# Patient Record
Sex: Female | Born: 1985 | Race: White | Hispanic: No | Marital: Married | Smoking: Never smoker
Health system: Southern US, Community
[De-identification: ages and names within clinical notes are randomized; demographics above are authoritative.]

## PROBLEM LIST (undated history)

## (undated) DIAGNOSIS — I951 Orthostatic hypotension: Principal | ICD-10-CM

## (undated) DIAGNOSIS — R000 Tachycardia, unspecified: Principal | ICD-10-CM

## (undated) DIAGNOSIS — R945 Abnormal results of liver function studies: Secondary | ICD-10-CM

## (undated) DIAGNOSIS — I498 Other specified cardiac arrhythmias: Secondary | ICD-10-CM

## (undated) DIAGNOSIS — D649 Anemia, unspecified: Secondary | ICD-10-CM

## (undated) DIAGNOSIS — O24419 Gestational diabetes mellitus in pregnancy, unspecified control: Secondary | ICD-10-CM

## (undated) DIAGNOSIS — G90A Postural orthostatic tachycardia syndrome (POTS): Secondary | ICD-10-CM

## (undated) DIAGNOSIS — D689 Coagulation defect, unspecified: Secondary | ICD-10-CM

## (undated) DIAGNOSIS — F419 Anxiety disorder, unspecified: Secondary | ICD-10-CM

## (undated) DIAGNOSIS — D682 Hereditary deficiency of other clotting factors: Secondary | ICD-10-CM

## (undated) DIAGNOSIS — Z5189 Encounter for other specified aftercare: Secondary | ICD-10-CM

## (undated) HISTORY — DX: Abnormal results of liver function studies: R94.5

## (undated) HISTORY — DX: Hereditary deficiency of other clotting factors: D68.2

## (undated) HISTORY — DX: Anxiety disorder, unspecified: F41.9

## (undated) HISTORY — DX: Anemia, unspecified: D64.9

## (undated) HISTORY — PX: COSMETIC SURGERY: SHX468

## (undated) HISTORY — DX: Gestational diabetes mellitus in pregnancy, unspecified control: O24.419

## (undated) HISTORY — DX: Encounter for other specified aftercare: Z51.89

## (undated) HISTORY — DX: Other specified cardiac arrhythmias: I49.8

## (undated) HISTORY — DX: Postural orthostatic tachycardia syndrome (POTS): G90.A

## (undated) HISTORY — DX: Coagulation defect, unspecified: D68.9

## (undated) HISTORY — DX: Orthostatic hypotension: I95.1

## (undated) HISTORY — DX: Tachycardia, unspecified: R00.0

---

## 2011-07-13 HISTORY — PX: MOUTH SURGERY: SHX715

## 2014-07-12 DIAGNOSIS — O24419 Gestational diabetes mellitus in pregnancy, unspecified control: Secondary | ICD-10-CM

## 2014-07-12 HISTORY — DX: Gestational diabetes mellitus in pregnancy, unspecified control: O24.419

## 2015-06-10 DIAGNOSIS — R7989 Other specified abnormal findings of blood chemistry: Secondary | ICD-10-CM

## 2015-06-10 DIAGNOSIS — R945 Abnormal results of liver function studies: Secondary | ICD-10-CM

## 2015-06-10 HISTORY — DX: Other specified abnormal findings of blood chemistry: R79.89

## 2016-03-24 ENCOUNTER — Ambulatory Visit: Payer: Self-pay | Admitting: Family

## 2016-05-18 ENCOUNTER — Other Ambulatory Visit: Payer: Self-pay | Admitting: Gastroenterology

## 2016-05-18 DIAGNOSIS — R945 Abnormal results of liver function studies: Secondary | ICD-10-CM

## 2016-05-18 DIAGNOSIS — R7989 Other specified abnormal findings of blood chemistry: Secondary | ICD-10-CM

## 2016-05-28 ENCOUNTER — Ambulatory Visit: Payer: Self-pay

## 2016-06-10 ENCOUNTER — Encounter (INDEPENDENT_AMBULATORY_CARE_PROVIDER_SITE_OTHER): Payer: Self-pay

## 2016-06-10 ENCOUNTER — Ambulatory Visit (INDEPENDENT_AMBULATORY_CARE_PROVIDER_SITE_OTHER): Payer: BLUE CROSS/BLUE SHIELD | Admitting: Internal Medicine

## 2016-06-10 ENCOUNTER — Ambulatory Visit: Payer: Self-pay | Admitting: Internal Medicine

## 2016-06-10 ENCOUNTER — Encounter: Payer: Self-pay | Admitting: Internal Medicine

## 2016-06-10 VITALS — BP 109/73 | HR 83 | Ht 59.0 in | Wt 96.2 lb

## 2016-06-10 DIAGNOSIS — G90A Postural orthostatic tachycardia syndrome (POTS): Secondary | ICD-10-CM

## 2016-06-10 DIAGNOSIS — R55 Syncope and collapse: Secondary | ICD-10-CM | POA: Diagnosis not present

## 2016-06-10 DIAGNOSIS — I951 Orthostatic hypotension: Secondary | ICD-10-CM

## 2016-06-10 DIAGNOSIS — R000 Tachycardia, unspecified: Secondary | ICD-10-CM

## 2016-06-10 MED ORDER — MIDODRINE HCL 2.5 MG PO TABS: ORAL_TABLET | ORAL | 1 refills | Status: DC

## 2016-06-10 NOTE — Patient Instructions (Addendum)
Medication Instructions: - Your physician has recommended you make the following change in your medication:  1) Proamitine (midodrine) 2.5 mg - take one tablet by mouth every 4 hours as needed  Labwork: - none ordered  Procedures/Testing: - .none ordered  Follow-Up: - Your physician recommends that you schedule a follow-up appointment in: 3 months with Dr. Graciela HusbandsKlein.  Any Additional Special Instructions Will Be Listed Below (If Applicable). 1) Thermatabs- take two tablets twice daily 2) Menses control 3) Increase fluid intake (water/ gatorade) 4) Compression clothes 5) Recumbant exercise   If you need a refill on your cardiac medications before your next appointment, please call your pharmacy.

## 2016-06-10 NOTE — Progress Notes (Signed)
ELECTROPHYSIOLOGY CONSULT NOTE  Patient ID: Catherine Mendez, MRN: [id number], DOB/AGE: 17-Aug-1985 30 y.o. Admit date: (Not on file) Date of Consult: 06/10/2016  Primary Physician: Jolene ProvostKirtida Patel, MD Primary Cardiologist: new  Consulting Physician new  Chief Complaint: syncope   HPI Catherine Mendez is a 30 y.o. female  With a 15 year history of syncope and exercise intolerance accompanied by palpitations and acral discoloration. There is also following this of thought.  She recounts her history well; she was seen at the [facility] [place] were no specific diagnosis was made. She does note that with her, history of "exercise syncope" that she was admitted for treadmill testing and had an event and no clear diagnosis was made--I am inferring from that that there was no arrhythmia. She has had problems with heat intolerance, menses intolerance, shower intolerance. Episodes of nausea and vomiting and/or diarrhea have been associated with frequent and predictable syncope. Interestingly, during the pregnancy with her daughter, Ivor MessierCora, she struggled throughout with hyperemesis never arriving at the phase of volume expansion wherein so many women will better in the second trimester.  She finally found a physician in [place] who thought she might be dysautonomic. This was true not withstanding a negative tilt table test. She was started on ProAmatine taking 2.5 mg once a day around the time of her heavy menses. She had noted an incredibly positive response to the drug. She has had problems with nausea and has taken some Zofran in the past with benefit.  She more recently has learned to abort her episodes by becoming recumbent.  We don't have access to her evaluation but I presume also that her echocardiogram was normal. She has been on birth control in the past. This was discontinued because of "tumors on her liver"   [facility]    Past Medical History:  Diagnosis  Date  . Factor XII deficiency (HCC)   . Gestational diabetes 2016  . POTS (postural orthostatic tachycardia syndrome)       Surgical History:  Past Surgical History:  Procedure Laterality Date  . MOUTH SURGERY  2013   bottom dental implant      Home Meds: Prior to Admission medications   Medication Sig Start Date End Date Taking? Authorizing Provider  ciprofloxacin (CIPRO) 100 MG tablet Take 100 mg by mouth 2 (two) times daily.    Yes Historical Provider, MD  midodrine (PROAMATINE) 2.5 MG tablet Take one tablet (2.5 mg) by mouth every 4 hours as needed 06/10/16  Yes Duke SalviaSteven C Kaisen Ackers, MD  predniSONE (DELTASONE) 10 MG tablet Take 5 mg by mouth daily with breakfast.   Yes Historical Provider, MD    Allergies:  Allergies  Allergen Reactions  . Iodine   . Shellfish Allergy Rash    Social History   Social History  . Marital status: Married    Spouse name: N/A  . Number of children: N/A  . Years of education: N/A   Occupational History  . Not on file.   Social History Main Topics  . Smoking status: Never Smoker  . Smokeless tobacco: Never Used  . Alcohol use Yes     Comment: wine occassionally  . Drug use: No  . Sexual activity: Not on file   Other Topics Concern  . Not on file   Social History Narrative  . No narrative on file     Family History  Problem Relation Age of Onset  . Clotting disorder Mother   .  Hypertension Father   . Clotting disorder Sister   . Factor VIII deficiency Sister      ROS:  Please see the history of present illness.     All other systems reviewed and negative.    Physical Exam: Blood pressure 109/73, pulse 83, height 4\' 11"  (1.499 m), weight 96 lb 4 oz (43.7 kg). General: Well developed, well nourished female in no acute distress. Head: Normocephalic, atraumatic, sclera non-icteric, no xanthomas, nares are without discharge. EENT: normal  Lymph Nodes:  none Neck: Negative for carotid bruits. JVD not elevated. Back:without  scoliosis kyphosis Lungs: Clear bilaterally to auscultation without wheezes, rales, or rhonchi. Breathing is unlabored. Heart: RRR with S1 S2. No  murmur . No rubs, or gallops appreciated. Abdomen: Soft, non-tender, non-distended with normoactive bowel sounds. No hepatomegaly. No rebound/guarding. No obvious abdominal masses. Msk:  Strength and tone appear normal for age. Extremities: No clubbing or cyanosis. No  edema.  Distal pedal pulses are 2+ and equal bilaterally. Skin: Warm and Dry Neuro: Alert and oriented X 3. CN III-XII intact Grossly normal sensory and motor function . Psych:  Responds to questions appropriately with a normal affect.      Labs: Cardiac Enzymes No results for input(s): CKTOTAL, CKMB, TROPONINI in the last 72 hours. CBC No results found for: WBC, HGB, HCT, MCV, PLT PROTIME: No results for input(s): LABPROT, INR in the last 72 hours. Chemistry No results for input(s): NA, K, CL, CO2, BUN, CREATININE, CALCIUM, PROT, BILITOT, ALKPHOS, ALT, AST, GLUCOSE in the last 168 hours.  Invalid input(s): LABALBU Lipids No results found for: CHOL, HDL, LDLCALC, TRIG BNP No results found for: PROBNP Thyroid Function Tests: No results for input(s): TSH, T4TOTAL, T3FREE, THYROIDAB in the last 72 hours.  Invalid input(s): FREET3 Miscellaneous No results found for: DDIMER  Radiology/Studies:  No results found.  EKG:    Assessment and Plan:  Dysautonomia  Liver tumors   We discussed extensively the issues of dysautonomia, the physiology of orthstasis and positional stress.  We discussed the role of salt and water repletion, the importance of exercise, often needing to be started in the recumbent position, and the awareness of triggers and the role of ambient heat and dehydration  We would recommend the following -increased fluid intake until the urine is clear -salt supplementation with either ThermaTabs or salt tablets 2 twice a day -follow-up with gynecology  regarding suppression of menses , although with a history of "liver tumors "I'm not quite sure what can be done-NDRF.org  And POTSPlace.com are potentially helpful websites - be very attentive to warning and becoming recumbent upon the onset of symptoms - exercise is very important. Recumbent exercise with the bike, rowing machine or swimming is preferred. It is important to remember that dizziness can be worse following exercise  We will initiate ProAmatine 2.5 mg 3 times daily. We discussed the alternative of using Florinef. I also mentioned that there are no good data in randomized trials as to the effectiveness but anecdotally at least the former has worked for her,          Sherryl MangesSteven Trenesha Alcaide

## 2016-09-09 ENCOUNTER — Encounter: Payer: Self-pay | Admitting: Internal Medicine

## 2016-09-09 ENCOUNTER — Ambulatory Visit (INDEPENDENT_AMBULATORY_CARE_PROVIDER_SITE_OTHER): Payer: BLUE CROSS/BLUE SHIELD | Admitting: Internal Medicine

## 2016-09-09 VITALS — BP 92/68 | HR 70 | Ht 59.0 in | Wt 97.5 lb

## 2016-09-09 DIAGNOSIS — G901 Familial dysautonomia [Riley-Day]: Secondary | ICD-10-CM

## 2016-09-09 DIAGNOSIS — G909 Disorder of the autonomic nervous system, unspecified: Secondary | ICD-10-CM | POA: Diagnosis not present

## 2016-09-09 MED ORDER — MIDODRINE HCL 2.5 MG PO TABS: ORAL_TABLET | ORAL | Status: DC

## 2016-09-09 NOTE — Progress Notes (Signed)
      Patient Care Team: Jolene ProvostKirtida Patel, MD as PCP - General (Family Medicine) Doy MinceHeather R Ratcliffe, PA-C (Physician Assistant)   HPI  Catherine Helmut MusterM Brassell is a 31 y.o. female Seen in follow-up for dysautonomia She has been seen in [place] and [place]. She had a negative tilt table test.  At her last visit we initiated ProAmatine and she had taken previously.  Overall she is much better. The end of the afternoons are somewhat of stroke. In this regard is notable that she takes her ProAmatine 7/12 and only twice a day  She continues to struggle with heat. She has difficulty on the stairs with exercise intolerance. She reminds me that interval training previously had been associated with recurrent syncope  R  Past Medical History:  Diagnosis Date  . Factor XII deficiency (HCC)   . Gestational diabetes 2016  . POTS (postural orthostatic tachycardia syndrome)     Past Surgical History:  Procedure Laterality Date  . MOUTH SURGERY  2013   bottom dental implant     Current Outpatient Prescriptions  Medication Sig Dispense Refill  . levofloxacin (LEVAQUIN) 500 MG tablet Take 500 mg by mouth daily.   0  . midodrine (PROAMATINE) 2.5 MG tablet Take one tablet (2.5 mg) by mouth every 4 hours as needed 120 tablet 1   No current facility-administered medications for this visit.     Allergies  Allergen Reactions  . Iodine   . Shellfish Allergy Rash      Review of Systems negative except from HPI and PMH  Physical Exam BP 92/68 (BP Location: Left Arm, Patient Position: Sitting, Cuff Size: Normal)   Pulse 70   Ht 4\' 11"  (1.499 m)   Wt 97 lb 8 oz (44.2 kg)   BMI 19.69 kg/m  Well developed and well nourished in no acute distress HENT normal E scleral and icterus clear Neck Supple JVP flat; carotids brisk and full Chest clear Regular rate and rhythm, no murmurs gallops or rub Soft with active bowel sounds No clubbing cyanosis  Edema Alert and oriented, grossly normal motor  and sensory function Skin Warm and Dry  ECG sinus rhythm at 70 Intervals 06/18/39  Assessment and  Plan  Dysautonomia  Hypotension  Syncope  No interval syncope. We will increase her ProAmatine from twice a day--3 times a day at 7/11/3 to try to help with the afternoon. I suggested she get a blood pressure monitor to see what her blood pressures are within target of increasing her ProAmatine for target blood pressure of about 100 to see if this doesn't help with her exercise symptoms. We have discussed strategies for returning to aerobic exercise including running, monitored running.   . We reviewed salt supplementation and have recommended NUUN electrolytes   We spent more than 50% of our >25 min visit in face to face counseling regarding the above     Current medicines are reviewed at length with the patient today .  The patient does not  have concerns regarding medicines.

## 2016-09-09 NOTE — Patient Instructions (Addendum)
Medication Instructions: - Your physician has recommended you make the following change in your medication:  1) Increase proamitine 2.5 mg- take one tablet at 7 am, 11 am, & 3 pm  Labwork: - none ordered  Procedures/Testing: - none ordered  Follow-Up: - Your physician wants you to follow-up in: May/ June with Dr. Graciela HusbandsKlein. You will receive a reminder letter in the mail two months in advance. If you don't receive a letter, please call our office to schedule the follow-up appointment.  Any Additional Special Instructions Will Be Listed Below (If Applicable).     If you need a refill on your cardiac medications before your next appointment, please call your pharmacy.

## 2016-09-10 ENCOUNTER — Encounter: Payer: Self-pay | Admitting: Emergency Medicine

## 2016-09-10 ENCOUNTER — Emergency Department
Admission: EM | Admit: 2016-09-10 | Discharge: 2016-09-10 | Disposition: A | Discharge status: Home / Self Care | Payer: BLUE CROSS/BLUE SHIELD | Attending: Emergency Medicine | Admitting: Emergency Medicine

## 2016-09-10 DIAGNOSIS — Z79899 Other long term (current) drug therapy: Secondary | ICD-10-CM | POA: Diagnosis not present

## 2016-09-10 DIAGNOSIS — R55 Syncope and collapse: Secondary | ICD-10-CM | POA: Diagnosis present

## 2016-09-10 DIAGNOSIS — K859 Acute pancreatitis without necrosis or infection, unspecified: Secondary | ICD-10-CM | POA: Diagnosis not present

## 2016-09-10 LAB — COMPREHENSIVE METABOLIC PANEL
ALBUMIN: 4.1 g/dL (ref 3.5–5.0)
ALK PHOS: 67 U/L (ref 38–126)
ALT: 44 U/L (ref 14–54)
AST: 46 U/L — ABNORMAL HIGH (ref 15–41)
Anion gap: 9 (ref 5–15)
BUN: 9 mg/dL (ref 6–20)
CALCIUM: 8.8 mg/dL — AB (ref 8.9–10.3)
CO2: 25 mmol/L (ref 22–32)
CREATININE: 0.69 mg/dL (ref 0.44–1.00)
Chloride: 103 mmol/L (ref 101–111)
GFR calc Af Amer: >60 mL/min (ref >60)
GFR calc non Af Amer: >60 mL/min (ref >60)
GLUCOSE: 99 mg/dL (ref 65–99)
Potassium: 3.9 mmol/L (ref 3.5–5.1)
SODIUM: 137 mmol/L (ref 135–145)
Total Bilirubin: 0.7 mg/dL (ref 0.3–1.2)
Total Protein: 7.3 g/dL (ref 6.5–8.1)

## 2016-09-10 LAB — CBC WITH DIFFERENTIAL/PLATELET
BASOS PCT: 1 %
Basophils Absolute: 0 10*3/uL (ref 0–0.1)
Eosinophils Absolute: 0.1 10*3/uL (ref 0–0.7)
Eosinophils Relative: 1 %
HEMATOCRIT: 39.7 % (ref 35.0–47.0)
HEMOGLOBIN: 13.6 g/dL (ref 12.0–16.0)
Lymphocytes Relative: 48 %
Lymphs Abs: 3.4 10*3/uL (ref 1.0–3.6)
MCH: 31.3 pg (ref 26.0–34.0)
MCHC: 34.3 g/dL (ref 32.0–36.0)
MCV: 91.1 fL (ref 80.0–100.0)
MONOS PCT: 13 %
Monocytes Absolute: 0.9 10*3/uL (ref 0.2–0.9)
NEUTROS ABS: 2.7 10*3/uL (ref 1.4–6.5)
NEUTROS PCT: 39 %
Platelets: 231 10*3/uL (ref 150–440)
RBC: 4.36 MIL/uL (ref 3.80–5.20)
RDW: 13.2 % (ref 11.5–14.5)
WBC: 7.1 10*3/uL (ref 3.6–11.0)

## 2016-09-10 LAB — POCT PREGNANCY, URINE: Preg Test, Ur: NEGATIVE

## 2016-09-10 LAB — LIPASE, BLOOD: Lipase: 112 U/L — ABNORMAL HIGH (ref 11–51)

## 2016-09-10 MED ORDER — SODIUM CHLORIDE 0.9 % IV BOLUS (SEPSIS)
1000.0000 mL | Freq: Once | INTRAVENOUS | Status: AC
  Administered 2016-09-10: 1000 mL via INTRAVENOUS

## 2016-09-10 NOTE — ED Provider Notes (Signed)
[facility] Emergency Department Provider Note  ____________________________________________   First MD Initiated Contact with Patient 09/10/16 1012     (approximate)  I have reviewed the triage vital signs and the nursing notes.   HISTORY  Chief Complaint Hypotension and Loss of Consciousness   HPI Catherine Mendez is a 31 y.o. female with a history of dysautonomia as well as multiple episodes of syncope who is presenting after taking a blood so today. She says that she was walking across the campus at her job when she began having abdominal cramping. She says that she then felt like she was going to pass out. She denies any chest pain or shortness of breath. However, she says that the feeling of her passing out involves a sensation of "my whole body choking." She says that this happened multiple times before but it is not happened for about 1 year. She says that it usually happens in response to pain. Patient said that she also had diarrhea this morning. Says that she had tomato sauce as well for dinner and that this usually causes her diarrhea. Says that she has had cramping in her right upper quadrant ever since she gave birth over one year ago and has had some difficulty with her liver labs. She is also on 5 mg of native drain for her dysautonomia and intermittent low blood pressure. She says that her normal systolics in the 90s and her normal diastolic pressures in the 60s. She is denying any pain at this time. Unclear exactly how long the patient was passed out. She said that she was found by her husband in her car unconscious and had been in and out of consciousness on the way to the hospital today.   Past Medical History:  Diagnosis Date  . Factor XII deficiency (HCC)   . Gestational diabetes 2016  . POTS (postural orthostatic tachycardia syndrome)     There are no active problems to display for this patient.   Past Surgical History:  Procedure  Laterality Date  . MOUTH SURGERY  2013   bottom dental implant     Prior to Admission medications   Medication Sig Start Date End Date Taking? Authorizing Provider  levofloxacin (LEVAQUIN) 500 MG tablet Take 500 mg by mouth daily.  08/29/16 09/11/16 Yes Historical Provider, MD  loperamide (IMODIUM) 2 MG capsule Take 2 mg by mouth as needed for diarrhea or loose stools.   Yes Historical Provider, MD  midodrine (PROAMATINE) 2.5 MG tablet Take one tablet (2.5 mg) three times a day at 7 am, 11 am, & 3 pm Patient taking differently: Take 5 mg by mouth 3 (three) times daily. Take one tablet (2.5 mg) three times a day at 7 am, 11 am, & 3 pm 09/09/16  Yes Duke Salvia, MD    Allergies Iodine and Shellfish allergy  Family History  Problem Relation Age of Onset  . Clotting disorder Mother   . Hypertension Father   . Clotting disorder Sister   . Factor VIII deficiency Sister     Social History Social History  Substance Use Topics  . Smoking status: Never Smoker  . Smokeless tobacco: Never Used  . Alcohol use Yes     Comment: wine occassionally    Review of Systems Constitutional: No fever/chills Eyes: No visual changes. ENT: No sore throat. Cardiovascular: Denies chest pain. Respiratory: Denies shortness of breath. Gastrointestinal: No nausea, no vomiting.  No constipation. Genitourinary: Negative for dysuria. Musculoskeletal: Negative for  back pain. Skin: Negative for rash. Neurological: Negative for headaches, focal weakness or numbness.  10-point ROS otherwise negative.  ____________________________________________   PHYSICAL EXAM:  VITAL SIGNS: ED Triage Vitals  Enc Vitals Group     BP 09/10/16 1013 (!) 79/39     Pulse Rate 09/10/16 1013 68     Resp 09/10/16 1013 18     Temp 09/10/16 1013 98.9 F (37.2 C)     Temp Source 09/10/16 1013 Oral     SpO2 09/10/16 1013 98 %     Weight 09/10/16 1013 98 lb (44.5 kg)     Height 09/10/16 1013 4\' 11"  (1.499 m)     Head  Circumference --      Peak Flow --      Pain Score 09/10/16 1014 4     Pain Loc --      Pain Edu? --      Excl. in GC? --     Constitutional: Alert and oriented. Well appearing and in no acute distress. Eyes: Conjunctivae are normal. PERRL. EOMI. Head: Atraumatic. Nose: No congestion/rhinnorhea. Mouth/Throat: Mucous membranes are moist.   Neck: No stridor.   Cardiovascular: Normal rate, regular rhythm. Grossly normal heart sounds.   Respiratory: Normal respiratory effort.  No retractions. Lungs CTAB. Gastrointestinal: Soft and nontender.Murphy sign. No distention.  Musculoskeletal: No lower extremity tenderness nor edema.  No joint effusions. Neurologic:  Normal speech and language. No gross focal neurologic deficits are appreciated.  Skin:  Skin is warm, dry and intact. No rash noted. Psychiatric: Mood and affect are normal. Speech and behavior are normal.  ____________________________________________   LABS (all labs ordered are listed, but only abnormal results are displayed)  Labs Reviewed  LIPASE, BLOOD - Abnormal; Notable for the following:       Result Value   Lipase 112 (*)    All other components within normal limits  COMPREHENSIVE METABOLIC PANEL - Abnormal; Notable for the following:    Calcium 8.8 (*)    AST 46 (*)    All other components within normal limits  CBC WITH DIFFERENTIAL/PLATELET  POC URINE PREG, ED  POCT PREGNANCY, URINE   ____________________________________________  EKG  ED ECG REPORT I, Swan Zayed,  Teena Irani, the attending physician, personally viewed and interpreted this ECG.   Date: 09/10/2016  EKG Time: 1012  Rate: 71  Rhythm: normal sinus rhythm  Axis: Normal  Intervals:none  ST&T Change: No ST segment elevation or depression. No abnormal T-wave inversion.  ____________________________________________  RADIOLOGY   ____________________________________________   PROCEDURES  Procedure(s) performed:   Procedures  Critical  Care performed:   ____________________________________________   INITIAL IMPRESSION / ASSESSMENT AND PLAN / ED COURSE  Pertinent labs & imaging results that were available during my care of the patient were reviewed by me and considered in my medical decision making (see chart for details).  ----------------------------------------- 1:41 PM on 09/10/2016 -----------------------------------------  Patient without abdominal pain at this time. Blood pressure remains in the 90s over 60s. Does not have an elevated lipase the patient without abdominal pain at this time. No vomiting in the emergency department. We discussed possible mild pancreatitis and eating a bland diet over the next few days. She has a gastroenterologist as well as a cardiologist who she will follow up with. To be discharged home at this time. Likely vasovagal syncope exacerbated by dehydration and the patient's underlying autonomic instability.      ____________________________________________   FINAL CLINICAL IMPRESSION(S) / ED DIAGNOSES  Syncope. Pancreatitis.  NEW MEDICATIONS STARTED DURING THIS VISIT:  New Prescriptions   No medications on file     Note:  This document was prepared using Dragon voice recognition software and may include unintentional dictation errors.    Myrna Blazeravid Matthew Fedrick Cefalu, MD 09/10/16 [phone]

## 2016-09-10 NOTE — ED Triage Notes (Signed)
Pt  Brought in by husband with c/o syncope, abd and nausea. Pt with hx of low bp. Pt very pale and lethargic in triage.

## 2016-11-05 ENCOUNTER — Encounter: Payer: Self-pay | Admitting: Medical

## 2016-11-05 ENCOUNTER — Ambulatory Visit: Payer: Self-pay | Admitting: Medical

## 2016-11-05 VITALS — BP 98/74 | HR 120 | Temp 100.6°F | Resp 18 | Ht 59.0 in | Wt 96.0 lb

## 2016-11-05 DIAGNOSIS — G90A Postural orthostatic tachycardia syndrome (POTS): Secondary | ICD-10-CM | POA: Insufficient documentation

## 2016-11-05 DIAGNOSIS — R000 Tachycardia, unspecified: Secondary | ICD-10-CM

## 2016-11-05 DIAGNOSIS — R112 Nausea with vomiting, unspecified: Secondary | ICD-10-CM

## 2016-11-05 DIAGNOSIS — I951 Orthostatic hypotension: Secondary | ICD-10-CM

## 2016-11-05 DIAGNOSIS — R197 Diarrhea, unspecified: Secondary | ICD-10-CM

## 2016-11-05 NOTE — Patient Instructions (Signed)
To go to the Emergency Department for fluid replacement and further evaluation.

## 2016-11-05 NOTE — Progress Notes (Addendum)
Patient did not go to the Emergency Department per my recommendation. However her vomiting and diarrhea continued and she went to [facility] Emergency Department on 11/09/2016. EKG showed borderline  QTc prolongation and nonspecific T-wave inversion in V1 no ST elevation. she was discharged home with Phenergan suppositories, Loperamide and Ondansetron..  Subjective:    Patient ID: Catherine Mendez, female    DOB: February 22, 1986, 31 y.o.   MRN: [id number]  HPI 31 yo female ( Ate [employer] salad 11am yesterday and the night before with some chicken)  started yesterday  with diarrhea in the morning  2-3 am,  Worked yesterday. Abdominal pain before lunch. Then after work started with vomiting  4 x yesterday and diarrhea 10 x / midnight , and continued through out the night.  Worsen diarrhea between  12am and 4 am. Got up about 7:30 am ( hr 145 after warm bath) and now 7 x diarrhea till 10am .  10 am -2 pm one time.  Total amount of diarrhea per patient is about 30 times total) Soiled herself 3 times because she could not reach the bathroom soon enough. Took imodium (2 tablets)  8 am and the second around  9 am. Diarrhea seemed to slow down.  Has not vomited any more.  Drank one 12 OZ  gatorade  At about noon with ice chips.  When  up and moving her heart starts to race and she gets short of breath. Midodrine last dose was at  8 am  And  12 noon  ( patient takes it 3 times / day).   Review of Systems  Constitutional: Positive for fatigue. Negative for chills and fever.  HENT: Negative.   Eyes: Negative.   Respiratory: Positive for shortness of breath. Negative for cough, choking and chest tightness.   Cardiovascular: Positive for palpitations. Negative for chest pain and leg swelling.  Gastrointestinal: Positive for abdominal pain, diarrhea, nausea and vomiting. Negative for abdominal distention, anal bleeding, blood in stool and constipation.  Endocrine: Negative for polydipsia, polyphagia and polyuria.   Genitourinary: Positive for dysuria. Negative for frequency, hematuria and urgency.  Musculoskeletal: Positive for myalgias. Negative for back pain, joint swelling, neck pain and neck stiffness.  Skin: Negative for rash and wound.  Neurological: Positive for dizziness, weakness and headaches. Negative for light-headedness.  Hematological: Negative for adenopathy. Does not bruise/bleed easily.  Psychiatric/Behavioral: Negative for agitation, behavioral problems, confusion, hallucinations, self-injury, sleep disturbance and suicidal ideas. The patient is not nervous/anxious.   feels she does look pale. Dizziness last night    Objective:   Physical Exam  Constitutional: She appears well-developed and well-nourished.  HENT:  Head: Normocephalic and atraumatic.  Right Ear: External ear normal.  Left Ear: External ear normal.  Nose: Nose normal.  Mouth/Throat: Oropharynx is clear and moist.  Eyes: EOM are normal. Pupils are equal, round, and reactive to light.  Neck: Normal range of motion. Neck supple.  Cardiovascular: Normal rate.   petite individual.  Hr 105 laying down. Pale looking.dry mucous membranes.          Assessment & Plan:  Vomitng and Diarrhea and abdominal pain, fever. Discussed with patient I think she is dehydrated and needs to go to the Emergency Department with her history of POTS. She is agreeable to this.  Called triage at [facility] [place] and reveiwed the patient/ name of patient with Elige Radon. Patient left while I was in seeing another patient. Patient escorted by Lynelle Doctor RN to her vehicle where her  husband was driving. He also  seemed concerned per RN.

## 2016-11-05 NOTE — Progress Notes (Signed)
Patient has had diarrhea and vomiting since yesterday.  On Wed night had a salad from a restaurant.  On Thursday she had diarrhea 3x before lunch, then had a salad and started feeling very poorly.  Has vomited about 4 times and has had diarrhea 20-30x since yesterday.  OTC imodium, gatorade.  Hx of POTS - or dysautonomia.

## 2016-11-09 ENCOUNTER — Emergency Department
Admission: EM | Admit: 2016-11-09 | Discharge: 2016-11-09 | Disposition: A | Discharge status: Home / Self Care | Payer: BLUE CROSS/BLUE SHIELD | Attending: Emergency Medicine | Admitting: Emergency Medicine

## 2016-11-09 ENCOUNTER — Encounter: Payer: Self-pay | Admitting: Medical Oncology

## 2016-11-09 DIAGNOSIS — Z79899 Other long term (current) drug therapy: Secondary | ICD-10-CM | POA: Diagnosis not present

## 2016-11-09 DIAGNOSIS — R55 Syncope and collapse: Secondary | ICD-10-CM | POA: Insufficient documentation

## 2016-11-09 DIAGNOSIS — R112 Nausea with vomiting, unspecified: Secondary | ICD-10-CM | POA: Insufficient documentation

## 2016-11-09 DIAGNOSIS — R197 Diarrhea, unspecified: Secondary | ICD-10-CM | POA: Insufficient documentation

## 2016-11-09 LAB — CBC
HCT: 40.8 % (ref 35.0–47.0)
HEMOGLOBIN: 14 g/dL (ref 12.0–16.0)
MCH: 31.4 pg (ref 26.0–34.0)
MCHC: 34.3 g/dL (ref 32.0–36.0)
MCV: 91.5 fL (ref 80.0–100.0)
Platelets: 237 10*3/uL (ref 150–440)
RBC: 4.46 MIL/uL (ref 3.80–5.20)
RDW: 13.2 % (ref 11.5–14.5)
WBC: 8.1 10*3/uL (ref 3.6–11.0)

## 2016-11-09 LAB — COMPREHENSIVE METABOLIC PANEL
ALBUMIN: 4.2 g/dL (ref 3.5–5.0)
ALT: 18 U/L (ref 14–54)
ANION GAP: 7 (ref 5–15)
AST: 20 U/L (ref 15–41)
Alkaline Phosphatase: 62 U/L (ref 38–126)
BILIRUBIN TOTAL: 0.6 mg/dL (ref 0.3–1.2)
BUN: 7 mg/dL (ref 6–20)
CO2: 26 mmol/L (ref 22–32)
Calcium: 8.7 mg/dL — ABNORMAL LOW (ref 8.9–10.3)
Chloride: 104 mmol/L (ref 101–111)
Creatinine, Ser: 0.64 mg/dL (ref 0.44–1.00)
GFR calc Af Amer: >60 mL/min (ref >60)
GFR calc non Af Amer: >60 mL/min (ref >60)
GLUCOSE: 86 mg/dL (ref 65–99)
POTASSIUM: 3.5 mmol/L (ref 3.5–5.1)
SODIUM: 137 mmol/L (ref 135–145)
TOTAL PROTEIN: 7.4 g/dL (ref 6.5–8.1)

## 2016-11-09 LAB — URINALYSIS, COMPLETE (UACMP) WITH MICROSCOPIC
Bacteria, UA: NONE SEEN
Bilirubin Urine: NEGATIVE
Glucose, UA: NEGATIVE mg/dL
Ketones, ur: NEGATIVE mg/dL
Leukocytes, UA: NEGATIVE
NITRITE: NEGATIVE
Protein, ur: NEGATIVE mg/dL
SPECIFIC GRAVITY, URINE: 1.009 (ref 1.005–1.030)
pH: 5 (ref 5.0–8.0)

## 2016-11-09 LAB — LIPASE, BLOOD: LIPASE: 20 U/L (ref 11–51)

## 2016-11-09 LAB — POCT PREGNANCY, URINE: Preg Test, Ur: NEGATIVE

## 2016-11-09 MED ORDER — ONDANSETRON 4 MG PO TBDP: 4.0000 mg | ORAL_TABLET | Freq: Three times a day (TID) | ORAL | 0 refills | Status: DC | PRN

## 2016-11-09 MED ORDER — SODIUM CHLORIDE 0.9 % IV BOLUS (SEPSIS)
1000.0000 mL | Freq: Once | INTRAVENOUS | Status: AC
Start: 2016-11-09 — End: 2016-11-09
  Administered 2016-11-09: 1000 mL via INTRAVENOUS

## 2016-11-09 MED ORDER — ONDANSETRON HCL 4 MG/2ML IJ SOLN
4.0000 mg | Freq: Once | INTRAMUSCULAR | Status: AC
  Administered 2016-11-09: 4 mg via INTRAVENOUS
  Filled 2016-11-09: qty 2

## 2016-11-09 MED ORDER — PROMETHAZINE HCL 25 MG RE SUPP: 25.0000 mg | Freq: Four times a day (QID) | RECTAL | 0 refills | Status: DC | PRN

## 2016-11-09 MED ORDER — LOPERAMIDE HCL 2 MG PO TABS: 2.0000 mg | ORAL_TABLET | Freq: Four times a day (QID) | ORAL | 0 refills | Status: DC | PRN

## 2016-11-09 MED ORDER — SODIUM CHLORIDE 0.9 % IV BOLUS (SEPSIS)
1000.0000 mL | Freq: Once | INTRAVENOUS | Status: AC
  Administered 2016-11-09: 1000 mL via INTRAVENOUS

## 2016-11-09 NOTE — ED Provider Notes (Signed)
[facility] Emergency Department Provider Note  ____________________________________________  Time seen: Approximately 9:13 AM  I have reviewed the triage vital signs and the nursing notes.   HISTORY  Chief Complaint Emesis and Diarrhea    HPI Raynisha TYJAH HAI is a 31 y.o. female with a history of POTS presenting with 5 days of nausea vomiting and diarrhea associated with lightheadedness. The patient reportsthat both her daughter and husband had several days of similar symptoms prior to the onset of her symptoms. On Thursday, she developed multiple episodes of nausea and vomiting as well as loose stool that was nonbloody. She took "lots" of loperamide, and then did not have any bowel movement for 2-3 days. This morning, she developed loose stool again.  Over the past several days, she has had persistent orthostatic lightheadedness, and 2 occasions where she felt presyncopal. She is not had any fever, localized abdominal pain, urinary symptoms.  She has been unable to take her midodrine and pantoprazole due to vomiting.   Past Medical History:  Diagnosis Date  . Factor XII deficiency (HCC)   . Gestational diabetes 2016  . POTS (postural orthostatic tachycardia syndrome)     Patient Active Problem List   Diagnosis Date Noted  . POTS (postural orthostatic tachycardia syndrome) 11/05/2016    Past Surgical History:  Procedure Laterality Date  . MOUTH SURGERY  2013   bottom dental implant     Current Outpatient Rx  . Order #: [id number] Class: Historical Med  . Order #: [id number] Class: Print  . Order #: [id number] Class: No Print  . Order #: [id number] Class: Print  . Order #: [id number] Class: Historical Med  . Order #: [id number] Class: Print    Allergies Iodine and Shellfish allergy  Family History  Problem Relation Age of Onset  . Clotting disorder Mother   . Hypertension Father   . Clotting disorder Sister   . Factor VIII deficiency Sister      Social History Social History  Substance Use Topics  . Smoking status: Never Smoker  . Smokeless tobacco: Never Used  . Alcohol use Yes     Comment: wine occassionally    Review of Systems Constitutional: No fever/chills.Positive lightheadedness without syncope. Positive presyncope. Eyes: No visual changes. ENT: No sore throat. No congestion or rhinorrhea. No ear pain. Cardiovascular: Denies chest pain. Denies palpitations. Respiratory: Denies shortness of breath.  No cough. Gastrointestinal: Mild nonfocal abdominal "soreness."  + nausea, + vomiting.  + diarrhea.  No constipation. Genitourinary: Negative for dysuria. No urinary frequency. Musculoskeletal: Negative for back pain. Skin: Negative for rash. Neurological: Negative for headaches. No focal numbness, tingling or weakness.   10-point ROS otherwise negative.  ____________________________________________   PHYSICAL EXAM:  VITAL SIGNS: ED Triage Vitals  Enc Vitals Group     BP 11/09/16 0847 100/77     Pulse Rate 11/09/16 0842 95     Resp 11/09/16 0842 18     Temp 11/09/16 0842 97.9 F (36.6 C)     Temp Source 11/09/16 0842 Oral     SpO2 11/09/16 0842 99 %     Weight 11/09/16 0842 95 lb (43.1 kg)     Height 11/09/16 0842  (1.499 m)     Head Circumference --      Peak Flow --      Pain Score 11/09/16 0841 2     Pain Loc --      Pain Edu? --      Excl. in GC? --  Constitutional: Alert and oriented. Well appearing and in no acute distress. Answers questions appropriately. Eyes: Conjunctivae are normal.  EOMI. No scleral icterus. Head: Atraumatic. Nose: No congestion/rhinnorhea. Mouth/Throat: Mucous membranes are Mildly dry.  Neck: No stridor.  Supple.  No meningismus Cardiovascular: Normal rate, regular rhythm. No murmurs, rubs or gallops. Blood pressure 98/77 on my evaluation. Respiratory: Normal respiratory effort.  No accessory muscle use or retractions. Lungs CTAB.  No wheezes, rales or  ronchi. Gastrointestinal: Soft, nontender and nondistended.  No guarding or rebound.  No peritoneal signs. Musculoskeletal: No LE edema.  Neurologic:  A&Ox3.  Speech is clear.  Face and smile are symmetric.  EOMI.  Moves all extremities well. Skin:  Skin is warm, dry and intact. No rash noted. Psychiatric: Mood and affect are normal. Speech and behavior are normal.  Normal judgement.  ____________________________________________   LABS (all labs ordered are listed, but only abnormal results are displayed)  Labs Reviewed  COMPREHENSIVE METABOLIC PANEL - Abnormal; Notable for the following:       Result Value   Calcium 8.7 (*)    All other components within normal limits  LIPASE, BLOOD  CBC  URINALYSIS, COMPLETE (UACMP) WITH MICROSCOPIC  POC URINE PREG, ED   ____________________________________________  EKG  ED ECG REPORT I, Rockne Menghini, the attending physician, personally viewed and interpreted this ECG.   Date: 11/09/2016  EKG Time: 927  Rate: 93  Rhythm: normal sinus rhythm  Axis: normal  Intervals:borderline QTc prolongation  ST&T Change: Nonspecific T-wave inversion in V1. No ST elevation.  ____________________________________________  RADIOLOGY  No results found.  ____________________________________________   PROCEDURES  Procedure(s) performed: None  Procedures  Critical Care performed: No ____________________________________________   INITIAL IMPRESSION / ASSESSMENT AND PLAN / ED COURSE  Pertinent labs & imaging results that were available during my care of the patient were reviewed by me and considered in my medical decision making (see chart for details).  31 y.o. female with a history of POTS presenting with 5 days of nausea vomiting and diarrhea, now with orthostatic symptoms. Overall, the patient's symptoms are most consistent with a viral GI illness, due to her sick contacts. Her abdominal examination is reassuring, and a focal  surgical pathology is much less likely. The patient is likely symptomatic from dehydration and hypovolemia due to volume loss, exacerbated by her baseline POTS. We'll get basic laboratory studies, rule out pregnancy, UTI, and treat the patient with fluid until her symptoms improve. There is no indication for imaging at this time but I will continue to monitor her abdominal examinations for any clinical change.  Plan re-evaluation for final disposition.  ----------------------------------------- 10:32 AM on 11/09/2016 -----------------------------------------  The patient's orthostatic vital signs are reassuring. Her laboratory studies are within normal limits. She has been ambulatory and tolerating clear liquids without any difficulty. The patient is safe for discharge at this time.  ____________________________________________  FINAL CLINICAL IMPRESSION(S) / ED DIAGNOSES  Final diagnoses:  Nausea vomiting and diarrhea  Pre-syncope         NEW MEDICATIONS STARTED DURING THIS VISIT:  New Prescriptions   LOPERAMIDE (IMODIUM A-D) 2 MG TABLET    Take 1 tablet (2 mg total) by mouth 4 (four) times daily as needed for diarrhea or loose stools.   ONDANSETRON (ZOFRAN ODT) 4 MG DISINTEGRATING TABLET    Take 1 tablet (4 mg total) by mouth every 8 (eight) hours as needed for nausea or vomiting.   PROMETHAZINE (PHENERGAN) 25 MG SUPPOSITORY    Place 1  suppository (25 mg total) rectally every 6 (six) hours as needed for nausea or vomiting.      Rockne Menghini, MD 11/09/16 [id number]

## 2016-11-09 NOTE — ED Notes (Signed)
Dr norman at bedside. 

## 2016-11-09 NOTE — ED Notes (Signed)
EKG done

## 2016-11-09 NOTE — ED Triage Notes (Signed)
Pt reports that she has been having nausea, vomiting and diarrhea for a couple of days. Pt has hx of POTS and has had hypotension also.

## 2016-11-09 NOTE — ED Notes (Signed)
Pt approx 100cc left of NS to infuse. Pt alert and oriented X4, active, cooperative, pt in NAD. RR even and unlabored, color WNL.

## 2016-11-09 NOTE — Discharge Instructions (Signed)
Please take a clear liquid diet for the next 24-48 hours, then advance to a bland diet as tolerated. You may take Zofran or Phenergan for nausea and loperamide for diarrhea.  Please drink plenty of fluid to prevent dehydration and lightheadedness.  Return to the emergency department if you develop severe pain, fever, inability to keep down fluids, or any other symptoms concerning to you.

## 2016-11-09 NOTE — ED Notes (Signed)
Pt awaiting second bag of IV fluids to be finished infusing before discharge.

## 2016-11-09 NOTE — ED Notes (Signed)
Pt to ed with c/o weakness from n/v/d since last week.  Pt states she has hx of POTS and struggles with hypotension.  Pt alert and oriented at this time. Skin warm and dry.  Family at beside.  Pt placed on CM,  Pt reports diarrhea x 5 in last 24 hours, vomiting x 1 in last 24 hours. IV started labs drawn and sent.  Pt resting on stretcher.

## 2016-11-09 NOTE — ED Notes (Signed)
Pt alert and oriented X4, active, cooperative, pt in NAD. RR even and unlabored, color WNL.  Pt informed to return if any life threatening symptoms occur.   

## 2016-11-25 ENCOUNTER — Ambulatory Visit: Payer: BLUE CROSS/BLUE SHIELD | Admitting: Internal Medicine

## 2017-01-04 ENCOUNTER — Ambulatory Visit: Payer: Self-pay | Admitting: Medical

## 2017-01-04 VITALS — BP 80/60 | HR 106 | Temp 98.6°F | Resp 16 | Ht 59.0 in | Wt 97.0 lb

## 2017-01-04 DIAGNOSIS — R5383 Other fatigue: Secondary | ICD-10-CM

## 2017-01-04 DIAGNOSIS — M545 Low back pain, unspecified: Secondary | ICD-10-CM

## 2017-01-04 DIAGNOSIS — R3129 Other microscopic hematuria: Secondary | ICD-10-CM

## 2017-01-04 LAB — POCT URINALYSIS DIPSTICK
BILIRUBIN UA: NEGATIVE
Blood, UA: NEGATIVE
GLUCOSE UA: NEGATIVE
KETONES UA: NEGATIVE
LEUKOCYTES UA: NEGATIVE
Nitrite, UA: NEGATIVE
PH UA: 6 (ref 5.0–8.0)
Protein, UA: NEGATIVE
Spec Grav, UA: <=1.005 — AB (ref 1.010–1.025)
Urobilinogen, UA: NEGATIVE E.U./dL — AB

## 2017-01-04 LAB — POCT URINE PREGNANCY: PREG TEST UR: NEGATIVE

## 2017-01-04 LAB — POC URINE PREG, ED: PREG TEST UR: NEGATIVE

## 2017-01-04 NOTE — Progress Notes (Signed)
Patient presents complaining of dull headaches, feeling exhausted and dark urine.  Symptoms started last wednesday with headaches and orange/brown urine.  She started pushing fluids - gatorade and took tylenol for headaches.  This continued through Friday.  Then felt vert exhausted over the weekend.  During the weekend she had a fever of 101.3-101.8.  She had a 1 day period on 12/30/16.  She doesn't feel as bad today, but has been drinking a lot.  No fever in clinic. Orthostatic vitals:  Lying:   BP 86/60 HR 94 O2sat 99% Sitting:  BP 84/60 HR 104 O2sat 99% Stand: BP 80/60  HR 110 O2 sat 99%  Subjective:    Patient ID: Catherine Mendez, female    DOB: [birth year], 32 y.o.   MRN: [id number]  HPI    31 yo female with POTS started ast week  Monday, Tuesday and Wednesday had a headache everyday with progresively more intense and was tired .  Started with period on Thursday light day long period , which is unusual for her ( one day long) she  then took 2 preganacy tests both negative.  Felt tired.  Had body aches and body joints. Fever on Sunday, Monday  101.3-101.8, Night sweats Saturday , Sunday and Monday woke her up , no chills. No headache now.  Increased Thirstiness. Urinating orangish brown last  4 days, so she decided to drink  gatorade 20 oz 8 /day  ( blue and purple in color) Yesterday,  6 the day before and  4 the day before that. Today you have had  4 gatorades. Saturday  Also had some water. Taking Midrin 2 tablets every 4 hours starting to keep her blood pressure up after waking up in the morning.  Typically stops 4 hours before bedtime. Says if she stops hydrating urine turns dark again. Has appointment with her cardiologist on Thursday, she says he most likely will change medication because blood pressure is staying consistly low.    Review of Systems  Constitutional: Positive for fatigue and fever. Negative for appetite change and chills.  HENT: Negative for congestion, ear discharge,  rhinorrhea and sore throat.   Eyes: Negative for discharge and itching.  Respiratory: Negative for cough, shortness of breath and wheezing.   Cardiovascular: Negative for chest pain.  Gastrointestinal: Negative for constipation, diarrhea, nausea and vomiting.  Endocrine: Positive for polydipsia and polyuria. Negative for polyphagia.  Genitourinary: Negative for dysuria.  Musculoskeletal: Positive for back pain.  Skin: Negative for color change and rash.  Allergic/Immunologic: Positive for food allergies. Negative for environmental allergies and immunocompromised state.  Neurological: Positive for headaches. Negative for dizziness, syncope and light-headedness.  Hematological: Does not bruise/bleed easily.  Psychiatric/Behavioral: Negative for agitation, confusion and hallucinations.   Possible hematuria    Objective:   Physical Exam  Constitutional: She is oriented to person, place, and time. She appears well-developed and well-nourished.  HENT:  Head: Normocephalic and atraumatic.  Eyes: EOM are normal. Pupils are equal, round, and reactive to light.  Neck: Normal range of motion.  Cardiovascular: Regular rhythm and normal heart sounds.  Tachycardia present.  Exam reveals no gallop and no friction rub.   No murmur heard. Pulmonary/Chest: Effort normal and breath sounds normal.  Musculoskeletal: Normal range of motion.  Neurological: She is alert and oriented to person, place, and time.  Skin: Skin is warm and dry.  Psychiatric: She has a normal mood and affect. Her behavior is normal. Judgment and thought content normal.  Nursing note  and vitals reviewed.   Petite individual weighs 97 lbs, smiling and pleasant today. BP and HR noted.  Orthostatics done\ .  urine  dip negative Pregnancy test negative  Assessment & Plan:  ? Dehydration and hematuria. Will do CBC w/diff and Met C Stat labs will call patient today with results. Encouraged patient to drink more water and  decrease gatorade.   7:36 pm  Reviewed labs with patient, elevated AST 149 and ALT 194  And Alk Phos  189 which she says has been elevated before, one then another then she says she did have them come back normal one time after that.  c/o back pain will call in Cipro 500 mg one po bid x 7 days for patient with a follow up on Friday morning. Bun 4 , To push fluids but not to drink 8 Gatorades (20 oz). If anything changes to call me.  Reviewed with  patient possible Kidney infection. Antibiotics called in per patient request , she has had trouble with pharmacy ( [facility]) getting her prescriptions electronically.  Called into [facility]  they close at  10 pm. To follow up with me on Friday, sooner if any concerns, she verbalizes understanding and has no questions at this time.

## 2017-01-05 LAB — CBC WITH DIFFERENTIAL/PLATELET
BASOS ABS: 0.2 10*3/uL (ref 0.0–0.2)
Basos: 3 %
EOS (ABSOLUTE): 0.1 10*3/uL (ref 0.0–0.4)
Eos: 1 %
Hematocrit: 38 % (ref 34.0–46.6)
Hemoglobin: 12.9 g/dL (ref 11.1–15.9)
IMMATURE GRANS (ABS): 0 10*3/uL (ref 0.0–0.1)
IMMATURE GRANULOCYTES: 0 %
LYMPHS: 66 %
Lymphocytes Absolute: 3.8 10*3/uL — ABNORMAL HIGH (ref 0.7–3.1)
MCH: 30.9 pg (ref 26.6–33.0)
MCHC: 33.9 g/dL (ref 31.5–35.7)
MCV: 91 fL (ref 79–97)
Monocytes Absolute: 0.7 10*3/uL (ref 0.1–0.9)
Monocytes: 12 %
NEUTROS ABS: 1 10*3/uL — AB (ref 1.4–7.0)
NEUTROS PCT: 18 %
Platelets: 169 10*3/uL (ref 150–379)
RBC: 4.17 x10E6/uL (ref 3.77–5.28)
RDW: 13.4 % (ref 12.3–15.4)
WBC: 5.8 10*3/uL (ref 3.4–10.8)

## 2017-01-05 LAB — COMPREHENSIVE METABOLIC PANEL
A/G RATIO: 1.5 (ref 1.2–2.2)
ALT: 194 IU/L — AB (ref 0–32)
AST: 149 IU/L — AB (ref 0–40)
Albumin: 4.1 g/dL (ref 3.5–5.5)
Alkaline Phosphatase: 189 IU/L — ABNORMAL HIGH (ref 39–117)
BILIRUBIN TOTAL: 0.5 mg/dL (ref 0.0–1.2)
BUN/Creatinine Ratio: 6 — ABNORMAL LOW (ref 9–23)
BUN: 4 mg/dL — ABNORMAL LOW (ref 6–20)
CO2: 27 mmol/L (ref 20–29)
Calcium: 8.9 mg/dL (ref 8.7–10.2)
Chloride: 102 mmol/L (ref 96–106)
Creatinine, Ser: 0.62 mg/dL (ref 0.57–1.00)
GFR calc Af Amer: 141 mL/min/{1.73_m2} (ref >59)
GFR calc non Af Amer: 122 mL/min/{1.73_m2} (ref >59)
Globulin, Total: 2.8 g/dL (ref 1.5–4.5)
Glucose: 98 mg/dL (ref 65–99)
POTASSIUM: 4.4 mmol/L (ref 3.5–5.2)
Sodium: 141 mmol/L (ref 134–144)
Total Protein: 6.9 g/dL (ref 6.0–8.5)

## 2017-01-06 ENCOUNTER — Encounter: Payer: Self-pay | Admitting: Internal Medicine

## 2017-01-06 ENCOUNTER — Ambulatory Visit (INDEPENDENT_AMBULATORY_CARE_PROVIDER_SITE_OTHER): Payer: BLUE CROSS/BLUE SHIELD | Admitting: Internal Medicine

## 2017-01-06 VITALS — BP 116/79 | HR 103 | Ht 59.0 in | Wt 96.2 lb

## 2017-01-06 DIAGNOSIS — G901 Familial dysautonomia [Riley-Day]: Secondary | ICD-10-CM

## 2017-01-06 DIAGNOSIS — G909 Disorder of the autonomic nervous system, unspecified: Secondary | ICD-10-CM

## 2017-01-06 DIAGNOSIS — I951 Orthostatic hypotension: Secondary | ICD-10-CM

## 2017-01-06 DIAGNOSIS — R55 Syncope and collapse: Secondary | ICD-10-CM | POA: Diagnosis not present

## 2017-01-06 MED ORDER — MIDODRINE HCL 2.5 MG PO TABS: ORAL_TABLET | ORAL | 3 refills | Status: DC

## 2017-01-06 MED ORDER — PROPRANOLOL HCL 20 MG PO TABS: ORAL_TABLET | ORAL | 6 refills | Status: DC

## 2017-01-06 NOTE — Progress Notes (Signed)
      Patient Care Team: Otelia Sergeantatcliffe, Heather R, PA-C as PCP - General (Physician Assistant) Doy Minceatcliffe, Heather R, PA-C (Physician Assistant)   HPI  Catherine Mendez is a 31 y.o. female Seen in follow-up for dysautonomia She has been seen in [place] and [place]. She had a negative tilt table test.  At her last visit we initiated ProAmatine and she had taken previously.  Overall she is much better. T   She continues to struggle with the heat. This is particularly a problem which she wants to play with her daughter outside.  She has intercurrently developed a UTI. Nausea and vomiting has required emergency room therapy for rehydration.      Past Medical History:  Diagnosis Date  . Factor XII deficiency (HCC)   . Gestational diabetes 2016  . POTS (postural orthostatic tachycardia syndrome)     Past Surgical History:  Procedure Laterality Date  . MOUTH SURGERY  2013   bottom dental implant     Current Outpatient Prescriptions  Medication Sig Dispense Refill  . Azelastine-Fluticasone (DYMISTA NA) Place into the nose.    . ciprofloxacin (CIPRO) 500 MG tablet TK 1 T PO BID FOR 7 DAYS  0  . midodrine (PROAMATINE) 2.5 MG tablet Take one tablet (2.5 mg) three times a day at 7 am, 11 am, & 3 pm (Patient taking differently: Take 10 mg by mouth 3 (three) times daily. Take one tablet (2.5 mg) three times a day at 7 am, 11 am, & 3 pm)    . pantoprazole (PROTONIX) 40 MG tablet Take by mouth.     No current facility-administered medications for this visit.     Allergies  Allergen Reactions  . Iodine   . Shellfish Allergy Rash      Review of Systems negative except from HPI and PMH  Physical Exam BP 116/79 (BP Location: Left Arm, Patient Position: Sitting, Cuff Size: Normal)   Pulse (!) 103   Ht 4\' 11"  (1.499 m)   Wt 96 lb 4 oz (43.7 kg)   LMP 12/30/2016 (Exact Date)   BMI 19.44 kg/m  Well developed and well nourished in no acute distress HENT normal E scleral and  icterus clear Neck Supple JVP flat; carotids brisk and full Chest clear Regular rate and rhythm, no murmurs gallops or rub Soft with active bowel sounds No clubbing cyanosis  Edema Alert and oriented, grossly normal motor and sensory function Skin Warm and Dry    Assessment and  Plan  Dysautonomia  Hypotension  Syncope  Sinus tach   No interval syncope. She has had need for intercurrent rehydration secondary to nausea or vomiting most recently with a UTI. We have discussed the value of small frequent fluid intake to try to avoid the need for intravenous hydration  We will try her on low-dose beta blockers. She's tried in the past. She recalls propranolol. She will use it on an as-needed basis to see if by attenuated her sinus tachycardia it may improve symptoms. We have also discussed the importance of exercise edema recumbent exercise and lower extremity strength training  Menses are reasonably well controlled  We spent more than 50% of our >25 min visit in face to face counseling regarding the above     Current medicines are reviewed at length with the patient today .  The patient does not  have concerns regarding medicines.

## 2017-01-06 NOTE — Patient Instructions (Signed)
Medication Instructions: - Your physician has recommended you make the following change in your medication:  1) Start inderal (propranolol) 20 mg - take one tablet by mouth twice daily as needed for fast heart rates/ palpitations  Labwork: - none ordered  Procedures/Testing: - none ordered  Follow-Up: - Your physician wants you to follow-up in: 6 months with Dr. Graciela HusbandsKlein. You will receive a reminder letter in the mail two months in advance. If you don't receive a letter, please call our office to schedule the follow-up appointment.   Any Additional Special Instructions Will Be Listed Below (If Applicable).     If you need a refill on your cardiac medications before your next appointment, please call your pharmacy.

## 2017-01-07 ENCOUNTER — Other Ambulatory Visit: Payer: Self-pay

## 2017-01-07 VITALS — BP 96/78 | HR 96 | Temp 98.2°F | Wt 95.8 lb

## 2017-01-07 DIAGNOSIS — M545 Low back pain: Secondary | ICD-10-CM

## 2017-01-07 LAB — POCT URINALYSIS DIPSTICK
GLUCOSE UA: NEGATIVE
KETONES UA: NEGATIVE
LEUKOCYTES UA: NEGATIVE
Nitrite, UA: NEGATIVE
PROTEIN UA: NEGATIVE
Urobilinogen, UA: 0.2 E.U./dL
pH, UA: 6 (ref 5.0–8.0)

## 2017-01-07 NOTE — Progress Notes (Unsigned)
   Subjective:    Patient ID: Catherine Mendez, female    DOB: [birth year], 31 y.o.   MRN: [id number]  HPI Returns to clinic  for follow up feeling better last night and this morning.100.9 fever yesterday, took nothing.  Because she did not feel that bad.  Abdomen aches a little with the Cipro medication but able to take it.   Review of Systems  Constitutional: Positive for fatigue and fever. Negative for activity change, appetite change and chills.  HENT: Negative for congestion, ear pain and sore throat.   Respiratory: Negative for shortness of breath.   Cardiovascular: Negative for chest pain.  Gastrointestinal: Positive for abdominal pain.  Endocrine: Positive for polydipsia.  Genitourinary: Negative for dysuria and hematuria.  Musculoskeletal: Positive for back pain.  Skin: Negative for rash.  Allergic/Immunologic: Positive for environmental allergies and food allergies.  Neurological: Negative for dizziness, syncope and light-headedness.   History of sinusitis in October ? Ragweed allergy possiblility.     Objective:   Physical Exam  Constitutional: She is oriented to person, place, and time. She appears well-developed and well-nourished.  HENT:  Head: Normocephalic and atraumatic.  Eyes: EOM are normal. Pupils are equal, round, and reactive to light.  Neck: Normal range of motion.  Cardiovascular: Normal rate and regular rhythm.  Exam reveals no gallop and no friction rub.   No murmur heard. Pulmonary/Chest: Effort normal and breath sounds normal.  Musculoskeletal: Normal range of motion.  Neurological: She is alert and oriented to person, place, and time.  Skin: Skin is warm and dry.  Psychiatric: She has a normal mood and affect. Her behavior is normal. Judgment and thought content normal.   Overall patient looks better.  Urine wnl except bili large present     Assessment & Plan:  Reviewed patient treatment plan with Dr. Sullivan LoneGilbert.  To recheck liver panel. Follow up  on next Thursday sooner if needed.

## 2017-01-08 LAB — HEPATIC FUNCTION PANEL
ALBUMIN: 4.3 g/dL (ref 3.5–5.5)
ALT: 158 IU/L — ABNORMAL HIGH (ref 0–32)
AST: 92 IU/L — AB (ref 0–40)
Alkaline Phosphatase: 214 IU/L — ABNORMAL HIGH (ref 39–117)
BILIRUBIN TOTAL: 0.7 mg/dL (ref 0.0–1.2)
Bilirubin, Direct: 0.24 mg/dL (ref 0.00–0.40)
Total Protein: 7.6 g/dL (ref 6.0–8.5)

## 2017-01-10 ENCOUNTER — Telehealth: Payer: Self-pay | Admitting: Medical

## 2017-01-10 DIAGNOSIS — N390 Urinary tract infection, site not specified: Secondary | ICD-10-CM

## 2017-01-10 LAB — URINE CULTURE

## 2017-01-10 MED ORDER — AMOXICILLIN-POT CLAVULANATE 875-125 MG PO TABS: 1.0000 | ORAL_TABLET | Freq: Two times a day (BID) | ORAL | 0 refills | Status: DC

## 2017-01-10 NOTE — Telephone Encounter (Signed)
Reviewed labs with patient.  E. Coli patient on Cipro which is  resistant . Called patient to stop Cipro . And to start Augmentin.( Sent to [facility] per patient request) Liver enzymes trending down except  ALK phos 189 to  214.  Will recheck in  2 weeks. Patient with follow up appointment on  01/13/17.

## 2017-01-13 ENCOUNTER — Ambulatory Visit: Payer: Self-pay | Admitting: Medical

## 2017-01-13 VITALS — BP 95/60 | HR 118 | Temp 97.9°F | Resp 16 | Ht 60.0 in | Wt 96.0 lb

## 2017-01-13 DIAGNOSIS — J301 Allergic rhinitis due to pollen: Secondary | ICD-10-CM

## 2017-01-13 DIAGNOSIS — N390 Urinary tract infection, site not specified: Secondary | ICD-10-CM

## 2017-01-13 DIAGNOSIS — R748 Abnormal levels of other serum enzymes: Secondary | ICD-10-CM

## 2017-01-13 DIAGNOSIS — R0982 Postnasal drip: Secondary | ICD-10-CM

## 2017-01-13 NOTE — Patient Instructions (Signed)
Return next week for liver panel and urine dip.

## 2017-01-13 NOTE — Progress Notes (Signed)
   Subjective:    Patient ID: Catherine Erikssoniffany M Mendez, female    DOB: Dec 18, 1985, 31 y.o.   MRN: [id number]  HPI  31 yo female  Feeling better, back pain improved.  Did have a migraine  Yesterday with vomiting took took tylenol and slept. Has Imitrex if she needs. Mild cough some of the time. Stopped allergy medication, loratadine in the spring. Patient states that if she gets bp fluctuates and then hr elevated.   Review of Systems  Constitutional: Negative for chills and fever.  HENT: Negative.   Eyes: Negative.   Respiratory: Positive for cough. Negative for shortness of breath.   Cardiovascular: Negative for chest pain.  Gastrointestinal: Negative for abdominal pain.  Genitourinary: Negative for dysuria.  Musculoskeletal: Positive for back pain.  Skin: Negative for rash.  Neurological: Negative for dizziness and syncope.  Hematological: Negative for adenopathy.  Psychiatric/Behavioral: Negative for behavioral problems.     Back pain improved.( Mild) Objective:   Physical Exam  Constitutional: She is oriented to person, place, and time. She appears well-developed and well-nourished.  HENT:  Head: Normocephalic and atraumatic.  Right Ear: External ear normal.  Left Ear: External ear normal.  Mouth/Throat: Oropharynx is clear and moist.  Eyes: Conjunctivae and EOM are normal. Pupils are equal, round, and reactive to light.  Neck: Normal range of motion. Neck supple.  Cardiovascular: Regular rhythm and normal heart sounds.   No extrasystoles are present. Tachycardia present.   Pulmonary/Chest: Effort normal and breath sounds normal.  Neurological: She is alert and oriented to person, place, and time.  Skin: Skin is warm and dry.  Psychiatric: She has a normal mood and affect. Her behavior is normal. Judgment and thought content normal.  Nursing note and vitals reviewed.   Looking well today.  Post nasal drip noted.   recheck hr 114  Assessment & Plan:  Urinary tract infection  with possible pyeonephritis, E. Coli from urine culture. Patient changed to Augmentin 875 mg one by mouth twice daily for  7 days. Overall feeling better.   Mild cough on/off most likely due to post nasal drip. Recommmend OTC Claritin ( has taken in the past) take as directed, continue Dymista nasal spray , take as directed. Drinking  Nuun electrolyte replacement  reecommend by her cardiologist instead of gatorade.   schedule  In one week Liver panel  And Urine dip possibly repeat urine culture, orders placed. POCT could not be placed as future orders.

## 2017-01-21 ENCOUNTER — Other Ambulatory Visit: Payer: Self-pay

## 2017-01-28 ENCOUNTER — Other Ambulatory Visit: Payer: Self-pay

## 2017-01-28 DIAGNOSIS — Z0000 Encounter for general adult medical examination without abnormal findings: Secondary | ICD-10-CM

## 2017-01-28 LAB — POCT URINALYSIS DIPSTICK
Bilirubin, UA: NEGATIVE
Glucose, UA: NEGATIVE
KETONES UA: NEGATIVE
LEUKOCYTES UA: NEGATIVE
Nitrite, UA: NEGATIVE
PROTEIN UA: NEGATIVE
Spec Grav, UA: 1.015 (ref 1.010–1.025)
Urobilinogen, UA: 0.2 E.U./dL
pH, UA: 6 (ref 5.0–8.0)

## 2017-01-28 NOTE — Progress Notes (Signed)
Catherine Mendez called patient 01/28/17 and patient verifies she is currently on her menses, denies any other symptoms.

## 2017-01-28 NOTE — Progress Notes (Signed)
Blood present in urine, pt states that she is on her menstrual cycle.

## 2017-01-29 LAB — VITAMIN D 25 HYDROXY (VIT D DEFICIENCY, FRACTURES): Vit D, 25-Hydroxy: 24.2 ng/mL — ABNORMAL LOW (ref 30.0–100.0)

## 2017-01-29 LAB — CMP12+LP+TP+TSH+6AC+CBC/D/PLT
A/G RATIO: 1.5 (ref 1.2–2.2)
ALBUMIN: 4.1 g/dL (ref 3.5–5.5)
ALK PHOS: 94 IU/L (ref 39–117)
ALT: 27 IU/L (ref 0–32)
AST: 28 IU/L (ref 0–40)
BASOS ABS: 0 10*3/uL (ref 0.0–0.2)
BASOS: 0 %
BILIRUBIN TOTAL: 0.6 mg/dL (ref 0.0–1.2)
BUN/Creatinine Ratio: 12 (ref 9–23)
BUN: 8 mg/dL (ref 6–20)
CHOLESTEROL TOTAL: 157 mg/dL (ref 100–199)
Calcium: 9.3 mg/dL (ref 8.7–10.2)
Chloride: 102 mmol/L (ref 96–106)
Chol/HDL Ratio: 3.4 ratio (ref 0.0–4.4)
Creatinine, Ser: 0.69 mg/dL (ref 0.57–1.00)
EOS (ABSOLUTE): 0.1 10*3/uL (ref 0.0–0.4)
EOS: 1 %
Estimated CHD Risk: 0.5 times avg. (ref 0.0–1.0)
FREE THYROXINE INDEX: 2.7 (ref 1.2–4.9)
GFR calc Af Amer: 136 mL/min/{1.73_m2} (ref >59)
GFR calc non Af Amer: 118 mL/min/{1.73_m2} (ref >59)
GGT: 44 IU/L (ref 0–60)
GLOBULIN, TOTAL: 2.7 g/dL (ref 1.5–4.5)
Glucose: 80 mg/dL (ref 65–99)
HDL: 46 mg/dL (ref >39)
HEMATOCRIT: 37.5 % (ref 34.0–46.6)
HEMOGLOBIN: 12.3 g/dL (ref 11.1–15.9)
IMMATURE GRANS (ABS): 0 10*3/uL (ref 0.0–0.1)
IMMATURE GRANULOCYTES: 0 %
Iron: 77 ug/dL (ref 27–159)
LDH: 191 IU/L (ref 119–226)
LDL CALC: 80 mg/dL (ref 0–99)
LYMPHS: 56 %
Lymphocytes Absolute: 3 10*3/uL (ref 0.7–3.1)
MCH: 29.8 pg (ref 26.6–33.0)
MCHC: 32.8 g/dL (ref 31.5–35.7)
MCV: 91 fL (ref 79–97)
MONOS ABS: 0.7 10*3/uL (ref 0.1–0.9)
Monocytes: 12 %
NEUTROS PCT: 31 %
Neutrophils Absolute: 1.7 10*3/uL (ref 1.4–7.0)
PLATELETS: 223 10*3/uL (ref 150–379)
Phosphorus: 3.9 mg/dL (ref 2.5–4.5)
Potassium: 4.1 mmol/L (ref 3.5–5.2)
RBC: 4.13 x10E6/uL (ref 3.77–5.28)
RDW: 13.4 % (ref 12.3–15.4)
SODIUM: 139 mmol/L (ref 134–144)
T3 Uptake Ratio: 29 % (ref 24–39)
T4, Total: 9.4 ug/dL (ref 4.5–12.0)
TRIGLYCERIDES: 157 mg/dL — AB (ref 0–149)
TSH: 1.72 u[IU]/mL (ref 0.450–4.500)
Total Protein: 6.8 g/dL (ref 6.0–8.5)
Uric Acid: 3.8 mg/dL (ref 2.5–7.1)
VLDL CHOLESTEROL CAL: 31 mg/dL (ref 5–40)
WBC: 5.5 10*3/uL (ref 3.4–10.8)

## 2017-02-07 NOTE — Progress Notes (Signed)
ty

## 2017-03-01 ENCOUNTER — Telehealth: Payer: Self-pay

## 2017-03-03 ENCOUNTER — Ambulatory Visit: Payer: Self-pay | Admitting: Adult Health

## 2017-03-03 VITALS — BP 94/66 | HR 85 | Temp 97.6°F

## 2017-03-03 DIAGNOSIS — M25511 Pain in right shoulder: Secondary | ICD-10-CM

## 2017-03-03 DIAGNOSIS — Z8669 Personal history of other diseases of the nervous system and sense organs: Secondary | ICD-10-CM

## 2017-03-03 DIAGNOSIS — M549 Dorsalgia, unspecified: Secondary | ICD-10-CM

## 2017-03-03 DIAGNOSIS — R29898 Other symptoms and signs involving the musculoskeletal system: Secondary | ICD-10-CM

## 2017-03-03 DIAGNOSIS — R748 Abnormal levels of other serum enzymes: Secondary | ICD-10-CM

## 2017-03-03 DIAGNOSIS — R51 Headache: Secondary | ICD-10-CM

## 2017-03-03 DIAGNOSIS — E559 Vitamin D deficiency, unspecified: Secondary | ICD-10-CM

## 2017-03-03 DIAGNOSIS — G8929 Other chronic pain: Secondary | ICD-10-CM

## 2017-03-03 DIAGNOSIS — M25551 Pain in right hip: Principal | ICD-10-CM

## 2017-03-03 DIAGNOSIS — R202 Paresthesia of skin: Secondary | ICD-10-CM

## 2017-03-03 MED ORDER — VITAMIN D (ERGOCALCIFEROL) 1.25 MG (50000 UNIT) PO CAPS: 50000.0000 [IU] | ORAL_CAPSULE | ORAL | 0 refills | Status: DC

## 2017-03-03 NOTE — Patient Instructions (Signed)
Paresthesia Paresthesia is a burning or prickling feeling. This feeling can happen in any part of the body. It often happens in the hands, arms, legs, or feet. Usually, it is not painful. In most cases, the feeling goes away in a short time and is not a sign of a serious problem. Follow these instructions at home:  Avoid drinking alcohol.  Try massage or needle therapy (acupuncture) to help with your problems.  Keep all follow-up visits as told by your doctor. This is important. Contact a doctor if:  You keep on having episodes of paresthesia.  Your burning or prickling feeling gets worse when you walk.  You have pain or cramps.  You feel dizzy.  You have a rash. Get help right away if:  You feel weak.  You have trouble walking or moving.  You have problems speaking, understanding, or seeing.  You feel confused.  You cannot control when you pee (urinate) or poop (bowel movement).  You lose feeling (numbness) after an injury.  You pass out (faint). This information is not intended to replace advice given to you by your health care provider. Make sure you discuss any questions you have with your health care provider. Document Released: 06/10/2008 Document Revised: 12/04/2015 Document Reviewed: 06/24/2014 Elsevier Interactive Patient Education  2018 [employer] Knee Pain, Adult Many things can cause knee pain. The pain often goes away on its own with time and rest. If the pain does not go away, tests may be done to find out what is causing the pain. Follow these instructions at home: Activity  Rest your knee.  Do not do things that cause pain.  Avoid activities where both feet leave the ground at the same time (high-impact activities). Examples are running, jumping rope, and doing jumping jacks. General instructions  Take medicines only as told by your doctor.  Raise (elevate) your knee when you are resting. Make sure your knee is higher than your heart.  Sleep  with a pillow under your knee.  If told, put ice on the knee: ? Put ice in a plastic bag. ? Place a towel between your skin and the bag. ? Leave the ice on for 20 minutes, 2-3 times a day.  Ask your doctor if you should wear an elastic knee support.  Lose weight if you are overweight. Being overweight can make your knee hurt more.  Do not use any tobacco products. These include cigarettes, chewing tobacco, or electronic cigarettes. If you need help quitting, ask your doctor. Smoking may slow down healing. Contact a doctor if:  The pain does not stop.  The pain changes or gets worse.  You have a fever along with knee pain.  Your knee gives out or locks up.  Your knee swells, and becomes worse. Get help right away if:  Your knee feels warm.  You cannot move your knee.  You have very bad knee pain.  You have chest pain.  You have trouble breathing. Summary  Many things can cause knee pain. The pain often goes away on its own with time and rest.  Avoid activities that put stress on your knee. These include running and jumping rope.  Get help right away if you cannot move your knee, or if your knee feels warm, or if you have trouble breathing. This information is not intended to replace advice given to you by your health care provider. Make sure you discuss any questions you have with your health care provider. Document Released: 09/24/2008  Document Revised: 06/22/2016 Document Reviewed: 06/22/2016 Elsevier Interactive Patient Education  2017 [employer] Shoulder Pain Many things can cause shoulder pain, including:  An injury to the area.  Overuse of the shoulder.  Arthritis.  The source of the pain can be:  Inflammation.  An injury to the shoulder joint.  An injury to a tendon, ligament, or bone.  Follow these instructions at home: Take these actions to help with your pain:  Squeeze a soft ball or a foam pad as much as possible. This helps to keep the  shoulder from swelling. It also helps to strengthen the arm.  Take over-the-counter and prescription medicines only as told by your health care provider.  If directed, apply ice to the area: ? Put ice in a plastic bag. ? Place a towel between your skin and the bag. ? Leave the ice on for 20 minutes, 2-3 times per day. Stop applying ice if it does not help with the pain.  If you were given a shoulder sling or immobilizer: ? Wear it as told. ? Remove it to shower or bathe. ? Move your arm as little as possible, but keep your hand moving to prevent swelling.  Contact a health care provider if:  Your pain gets worse.  Your pain is not relieved with medicines.  New pain develops in your arm, hand, or fingers. Get help right away if:  Your arm, hand, or fingers: ? Tingle. ? Become numb. ? Become swollen. ? Become painful. ? Turn white or blue. This information is not intended to replace advice given to you by your health care provider. Make sure you discuss any questions you have with your health care provider. Document Released: 04/07/2005 Document Revised: 02/22/2016 Document Reviewed: 10/21/2014 Elsevier Interactive Patient Education  2017 Else[employer] Inc. Hip Pain The hip is the joint between the upper legs and the lower pelvis. The bones, cartilage, tendons, and muscles of your hip joint support your body and allow you to move around. Hip pain can range from a minor ache to severe pain in one or both of your hips. The pain may be felt on the inside of the hip joint near the groin, or the outside near the buttocks and upper thigh. You may also have swelling or stiffness. Follow these instructions at home: Managing pain, stiffness, and swelling  If directed, apply ice to the injured area. ? Put ice in a plastic bag. ? Place a towel between your skin and the bag. ? Leave the ice on for 20 minutes, 2-3 times a day  Sleep with a pillow between your legs on your most comfortable  side.  Avoid any activities that cause pain. General instructions  Take over-the-counter and prescription medicines only as told by your health care provider.  Do any exercises as told by your health care provider.  Record the following: ? How often you have hip pain. ? The location of your pain. ? What the pain feels like. ? What makes the pain worse.  Keep all follow-up visits as told by your health care provider. This is important. Contact a health care provider if:  You cannot put weight on your leg.  Your pain or swelling continues or gets worse after one week.  It gets harder to walk.  You have a fever. Get help right away if:  You fall.  You have a sudden increase in pain and swelling in your hip.  Your hip is red or swollen or very tender to  touch. Summary  Hip pain can range from a minor ache to severe pain in one or both of your hips.  The pain may be felt on the inside of the hip joint near the groin, or the outside near the buttocks and upper thigh.  Avoid any activities that cause pain.  Record how often you have hip pain, the location of the pain, what makes it worse and what it feels like. This information is not intended to replace advice given to you by your health care provider. Make sure you discuss any questions you have with your health care provider. Document Released: 12/16/2009 Document Revised: 05/31/2016 Document Reviewed: 05/31/2016 Elsevier Interactive Patient Education  2017 [employer].

## 2017-03-03 NOTE — Progress Notes (Signed)
Subjective:     Patient ID: Catherine Mendez, female   DOB: 1985-10-30, 31 y.o.   MRN: [id number]  HPI  Patient is a  31 year old female in no acute distress who presents to the clinic with a history of right hip pain since 2009 as well as pain on her right side of her body including right shoulder. She reports that at worst her pain is a 5/10 in all of these areas.She reports her right knee is weak at times.  She reports she rarely takes antiinflammatory medications as it makes her bruise. She will occasionally take Ibuprofen for a migraine with relief. She has paresthesias in her bilateral hands and toes for last two years that she feels have worsened in the past two months. She also reports new episodes of dropping objects and being unable to open things such as peanut butter jars and other items with her right hand on multiple occasions.  She denies any blurred vision.  She denies any tick bites since two years ago and she did see an MD for this per her report.  Denies fatigue.She denies any recent illness or any trauma. 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  Blood pressure 94/66, pulse 85, temperature 97.6 F (36.4 C), temperature source Tympanic, last menstrual period 02/20/2017, SpO2 99 %.  She has a history of POTS( postural orthostatic tachycardia syndrome )- She denies any symptoms currently.  2014 CT head patient reports in [place] was normal She also reports having a MRI over 5 years ago and reports it was normal. Xray of hip around 5 year ago she reports was normal. She has a history of Migraines induced by birth control pills and hormones- these have resolved after removing birth control.  Physical Therapy for right hip after childbirth. She reports it dis improve mildly.   LMP - 02-24-17 regular cycle. .Denies pregnancy.   Patient Active Problem List   Diagnosis Date Noted  . POTS (postural orthostatic tachycardia syndrome) 11/05/2016      Current Outpatient Prescriptions:  .  Azelastine-Fluticasone (DYMISTA NA), Place into the nose., Disp: , Rfl:  .  midodrine (PROAMATINE) 2.5 MG tablet, Take 1-2 tablets (2.5 mg- 5 mg) three times a day at 7 am, 11 am, & 3 pm as directed, Disp: 540 tablet, Rfl: 3 .  pantoprazole (PROTONIX) 40 MG tablet, Take by mouth., Disp: , Rfl:  .  propranolol (INDERAL) 20 MG tablet, Take one tablet (20 mg) by mouth twice daily as needed for fast heart rates/ palpitations, Disp: 60 tablet, Rfl: 6 .  amoxicillin-clavulanate (AUGMENTIN) 875-125 MG tablet, Take 1 tablet by mouth 2 (two) times daily. (Patient not taking: Reported on 03/03/2017), Disp: 14 tablet, Rfl: 0  Review of Systems  Constitutional: Negative for activity change, appetite change, chills, diaphoresis, fatigue, fever and unexpected weight change.  HENT: Negative.   Eyes: Negative.   Respiratory: Negative.   Cardiovascular: Negative.   Gastrointestinal: Negative.   Endocrine: Negative for cold intolerance, heat intolerance, polydipsia, polyphagia and polyuria.  Genitourinary: Negative for decreased urine volume, difficulty urinating, dyspareunia, dysuria, enuresis, flank pain, frequency, genital sores, hematuria, menstrual problem, pelvic pain, urgency, vaginal bleeding, vaginal discharge and vaginal pain.       Occasionally she sneezes and urinates a trivial amount this is new. She did have with pregnancy two years ago where she has been the same though it had resolved.  She is able to control her bladder and denies nay  incontinence. She denies any uncontrolled bladder.   Musculoskeletal: Positive for arthralgias (hip, shoulder, knee on right side/ left knee hurts at times. She does report her right hip " pops" at times. ), back pain (lower bavk pain right side  and near scapula on right side for the last two years.) and myalgias (on right side. ). Negative for gait problem, joint swelling, neck pain and neck stiffness.  Skin: Negative for  color change, pallor, rash and wound.  Allergic/Immunologic: Negative for environmental allergies, food allergies and immunocompromised state.  Neurological: Positive for weakness (right sided muscle weakness per patinet from hip down only) and headaches (intermittently on right side/ she takes Advil for relief/ she gets this headache once per week and increased with menses. Sleep usually resiolves headache. ). Negative for dizziness, tremors, seizures, syncope, facial asymmetry, speech difficulty, light-headedness and numbness.       Lightheaded only with Potts syndrome and denies any symptoms recently or at present.  She has noticed increased difficulty opening bottles and " things" with her right hand over the past few months. This is new.  She denies any slurred speech.  Hematological: Negative.   Psychiatric/Behavioral: Negative.        Objective:   Physical Exam  Constitutional: She is oriented to person, place, and time. Vital signs are normal. She appears well-developed and well-nourished. She is active.  Non-toxic appearance. She does not have a sickly appearance. She does not appear ill. No distress. She is not intubated.  HENT:  Head: Normocephalic and atraumatic.  Right Ear: External ear normal.  Left Ear: External ear normal.  Nose: Nose normal.  Mouth/Throat: No oropharyngeal exudate.  Eyes: Pupils are equal, round, and reactive to light. Conjunctivae, EOM and lids are normal. Right eye exhibits no discharge and no exudate. Left eye exhibits no discharge and no exudate. Right conjunctiva is not injected. Right conjunctiva has no hemorrhage. Left conjunctiva is not injected. Left conjunctiva has no hemorrhage. No scleral icterus.  Neck: Trachea normal, normal range of motion, full passive range of motion without pain and phonation normal. Neck supple. Normal carotid pulses, no hepatojugular reflux and no JVD present. No tracheal tenderness, no spinous process tenderness and no  muscular tenderness present. Carotid bruit is not present. No neck rigidity. No tracheal deviation, no edema, no erythema and normal range of motion present. No Brudzinski's sign and no Kernig's sign noted. No thyroid mass and no thyromegaly present.  Cardiovascular: Normal rate, regular rhythm, S1 normal, S2 normal, normal heart sounds, intact distal pulses and normal pulses.  Exam reveals no gallop, no distant heart sounds and no friction rub.   No murmur heard. Pulses:      Radial pulses are 2+ on the right side, and 2+ on the left side.       Dorsalis pedis pulses are 2+ on the right side, and 2+ on the left side.       Posterior tibial pulses are 2+ on the right side, and 2+ on the left side.  Pulmonary/Chest: Effort normal and breath sounds normal. No accessory muscle usage or stridor. No apnea, no tachypnea and no bradypnea. She is not intubated. No respiratory distress. She has no wheezes. She has no rales. She exhibits no tenderness.  Abdominal: Soft. Normal aorta and bowel sounds are normal. She exhibits no shifting dullness, no distension, no pulsatile liver, no fluid wave, no abdominal bruit, no ascites, no pulsatile midline mass and no mass. There is no hepatosplenomegaly, splenomegaly or hepatomegaly.  There is no tenderness. There is no rebound, no guarding and no CVA tenderness. No hernia.  Musculoskeletal: She exhibits no edema or deformity.       Right shoulder: She exhibits decreased range of motion (mild with reaching towards ceiling).       Right hip: She exhibits decreased range of motion (mildly decreased with leaning to her right side while standing.) and bony tenderness. She exhibits normal strength, no tenderness, no swelling, no crepitus, no deformity and no laceration.       Right knee: Normal. She exhibits normal range of motion, no swelling, no effusion, no ecchymosis, no deformity, no laceration, no erythema, normal alignment, no LCL laxity, normal patellar mobility, no bony  tenderness, normal meniscus and no MCL laxity. No tenderness found. No medial joint line, no lateral joint line, no MCL, no LCL and no patellar tendon tenderness noted.       Thoracic back: She exhibits bony tenderness (mild with palpation). She exhibits normal range of motion, no tenderness, no swelling, no edema, no deformity, no laceration, no pain, no spasm and normal pulse.  She has good range of motion, mildly decreased as documented in right shoulder/ right hip. Mild pain with palpation of upper to mid thoracic. No erythema or joint swelling. Negative Straight Leg Raise bilateral legs. Empty Beer Can Test Negative. Apley Scratch test negative. Mild decrease in grip strength right hand compared to left.   Lymphadenopathy:       Head (right side): No submental, no submandibular, no tonsillar, no preauricular, no posterior auricular and no occipital adenopathy present.       Head (left side): No submental, no submandibular, no tonsillar, no preauricular, no posterior auricular and no occipital adenopathy present.    She has no cervical adenopathy.       Right cervical: No superficial cervical, no deep cervical and no posterior cervical adenopathy present.      Left cervical: No superficial cervical, no deep cervical and no posterior cervical adenopathy present.  Neurological: She is alert and oriented to person, place, and time. She has normal strength and normal reflexes. She displays no atrophy and no tremor. No cranial nerve deficit or sensory deficit. She exhibits normal muscle tone. She displays a negative Romberg sign. She displays no seizure activity. Coordination and gait normal. GCS eye subscore is 4. GCS verbal subscore is 5. GCS motor subscore is 6.  Reflex Scores:      Tricep reflexes are 2+ on the right side and 2+ on the left side.      Bicep reflexes are 2+ on the right side and 2+ on the left side.      Brachioradialis reflexes are 2+ on the right side and 2+ on the left side.       Patellar reflexes are 2+ on the right side and 2+ on the left side.      Achilles reflexes are 2+ on the right side and 2+ on the left side. Gait is sure and steady. Negative Romberg.   Skin: Skin is warm, dry and intact. No abrasion, no bruising, no burn, no ecchymosis, no lesion, no petechiae and no rash noted. She is not diaphoretic. No cyanosis or erythema. No pallor. Nails show no clubbing.  Psychiatric: She has a normal mood and affect. Her speech is normal and behavior is normal. Judgment and thought content normal. Cognition and memory are normal.  Vitals reviewed.      Assessment:     1. Right sided  shoulder, and hip pain, and back pain.  2. Persistent Paresthesia, unable to open objects with right hand as she previously could.     Plan:   1. Discussed plan with Dr. Sullivan Lone Supervising physician/ agrees will send to neurology for new symptoms. Will obtain x- rays as below and start Vitamin D as E prescribed below. Will call with results. Patient to call office  if no results in three days.   Will also refer to Neurology given Paresthesias bilateral hands/ feet, new onset of decreased ability to open objects and grip at  Times/ history of headaches 2 to 3 per week.    Orders Placed This Encounter  Procedures  . DG Chest 2 View    Standing Status:   Future    Standing Expiration Date:   05/03/2018    Order Specific Question:   Reason for Exam (SYMPTOM  OR DIAGNOSIS REQUIRED)    Answer:   pain right side of body/ scapular 2 months    Order Specific Question:   Is patient pregnant?    Answer:   No    Comments:   LMP 02/24/17    Order Specific Question:   Preferred imaging location?    Answer:   [facility]    Order Specific Question:   Radiology Contrast Protocol - do NOT remove file path    Answer:   \\[email or web address]  . DG HIP UNILAT W OR W/O PELVIS 2-3 VIEWS RIGHT    Standing Status:   Future    Standing Expiration Date:    05/03/2018    Order Specific Question:   Reason for Exam (SYMPTOM  OR DIAGNOSIS REQUIRED)    Answer:   pain right hip x 9 years/ worsening last 2 months    Order Specific Question:   Is patient pregnant?    Answer:   No    Comments:   LMP 02/24/17    Order Specific Question:   Preferred imaging location?    Answer:   [facility]    Order Specific Question:   Radiology Contrast Protocol - do NOT remove file path    Answer:   \\[email or web address]  . 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    Answer:   No    Comments:   LMP 02/24/17    Order Specific Question:   Preferred imaging location?    Answer:   [facility]    Order Specific Question:   Radiology Contrast Protocol - do NOT remove file path    Answer:   \\[email or web address]  . DG Cervical Spine 2 or 3 views    Standing Status:   Future    Standing Expiration Date:   05/03/2018    Order Specific Question:   Reason for Exam (SYMPTOM  OR DIAGNOSIS REQUIRED)    Answer:   pain in right arm/ spine/ headaches/ rule out cervical cause    Order Specific Question:   Is patient pregnant?    Answer:   No    Comments:   LMP 02/24/17    Order Specific Question:   Preferred imaging location?    Answer:   [facility] [place]  Order Specific Question:   Radiology Contrast Protocol - do NOT remove file path    Answer:   \\[email or web address]s.[email or web address]  . DG Lumbar Spine 2-3 Views    Standing Status:   Future    Standing Expiration Date:   05/03/2018    Order Specific Question:   Reason for Exam (SYMPTOM  OR DIAGNOSIS REQUIRED)    Answer:   pain in spine/ hip and  right arm rule out spinal causes    Order Specific Question:   Is patient pregnant?    Answer:   No    Comments:   LMP 02/24/17    Order Specific Question:   Preferred imaging location?    Answer:   [facility]    Order Specific Question:   Radiology Contrast Protocol - do NOT remove file path    Answer:   \\[email or web address]  . DG Thoracic Spine 2 View    Standing Status:   Future    Standing Expiration Date:   05/03/2018    Order Specific Question:   Reason for Exam (SYMPTOM  OR DIAGNOSIS REQUIRED)    Answer:   pain in spine x 9 years worsening in last 2 months/ pain with palpation  upper  to mid thoracic    Order Specific Question:   Is patient pregnant?    Answer:   No    Comments:   LMP 02/24/17    Order Specific Question:   Preferred imaging location?    Answer:   [facility]    Order Specific Question:   Radiology Contrast Protocol - do NOT remove file path    Answer:   \\[email or web address]  . VITAMIN D 25 Hydroxy (Vit-D Deficiency, Fractures)    Standing Status:   Future    Standing Expiration Date:   03/03/2018  . CBC with Differential/Platelet    Standing Status:   Future    Standing Expiration Date:   06/03/2017  . Comprehensive metabolic panel    Standing Status:   Future    Standing Expiration Date:   06/03/2017  . B12 and Folate Panel  . Ambulatory referral to Neurology    Referral Priority:   Routine    Referral Type:   Consultation    Referral Reason:   Specialty Services Required    Requested Specialty:   Neurology    Number of Visits Requested:   1    Return in about 1 week (around 03/10/2017), or if symptoms worsen or fail to improve, for urgent care/Emergency room if symptoms change or worsen or any loss of bladder control. If she has not heard from Neuro referral within one week she will call the office.  Patient verbalized understanding of instructions and denies any  further questions at this time.

## 2017-03-04 ENCOUNTER — Ambulatory Visit
Admission: RE | Admit: 2017-03-04 | Discharge: 2017-03-04 | Disposition: A | Discharge status: Home / Self Care | Payer: BLUE CROSS/BLUE SHIELD | Source: Ambulatory Visit | Attending: Adult Health | Admitting: Adult Health

## 2017-03-04 DIAGNOSIS — G8929 Other chronic pain: Secondary | ICD-10-CM | POA: Diagnosis not present

## 2017-03-04 DIAGNOSIS — M25511 Pain in right shoulder: Secondary | ICD-10-CM | POA: Diagnosis present

## 2017-03-04 DIAGNOSIS — M25551 Pain in right hip: Secondary | ICD-10-CM | POA: Insufficient documentation

## 2017-03-04 DIAGNOSIS — M549 Dorsalgia, unspecified: Secondary | ICD-10-CM | POA: Diagnosis not present

## 2017-03-09 ENCOUNTER — Telehealth: Payer: Self-pay | Admitting: Medical

## 2017-03-09 NOTE — Telephone Encounter (Signed)
Called patient with xray results all wnl. , Shoulder motion and pain is better. Now has mid back and lower back pain and right hip pain. .  Says this is from an injury 2009 in cheerleading where she was dropped from the top of a pyramid. Says her right hip is forward and her left him is rotated back and it causes her to walk differently which in turn causes the back pain. She gets flare up and the last one was 1 -1 1/2 years ago. After giving childbirth  2 years ago. At that time she  " tore ligaments" in her right ankle. PT does improve the pain and it resolves.  She usually can manage the pain herself with  A TENS unit / ice/heat.  After her pregnancy she needed PT  6 months later which again helped her pain. She is scheduled for  An appointment with Neurology on Sept 4th for right hand weakness and paresthesia.

## 2017-03-11 ENCOUNTER — Ambulatory Visit: Payer: Self-pay | Admitting: Adult Health

## 2017-03-18 ENCOUNTER — Encounter: Payer: Self-pay | Admitting: Adult Health

## 2017-03-18 ENCOUNTER — Ambulatory Visit: Payer: Self-pay | Admitting: Adult Health

## 2017-03-18 VITALS — BP 90/64 | HR 85 | Temp 98.8°F | Ht 59.0 in | Wt 97.8 lb

## 2017-03-18 DIAGNOSIS — E559 Vitamin D deficiency, unspecified: Secondary | ICD-10-CM

## 2017-03-18 DIAGNOSIS — Z0000 Encounter for general adult medical examination without abnormal findings: Secondary | ICD-10-CM

## 2017-03-18 DIAGNOSIS — D682 Hereditary deficiency of other clotting factors: Secondary | ICD-10-CM

## 2017-03-18 DIAGNOSIS — R000 Tachycardia, unspecified: Secondary | ICD-10-CM

## 2017-03-18 DIAGNOSIS — G90A Postural orthostatic tachycardia syndrome (POTS): Secondary | ICD-10-CM

## 2017-03-18 DIAGNOSIS — I951 Orthostatic hypotension: Secondary | ICD-10-CM

## 2017-03-18 NOTE — Patient Instructions (Addendum)
Postural Orthostatic Tachycardia Syndrome Postural orthostatic tachycardia syndrome (POTS) is a group of symptoms that can occur when you stand up after lying down. POTS happens when less blood flows to the heart than normal after you stand up. The reduced blood flow to the heart makes the heart beat rapidly. POTS may be associated with another medical condition, or it may occur on its own. What are the causes? This cause of this condition is not known, but many conditions and diseases have been linked to it. What increases the risk? This condition is more likely to develop in:  Women 71-69 years old.  Women who are pregnant.  Women who are menstruating.  People who have certain conditions, including: ? A viral infection. ? An autoimmune disease. ? Anemia. ? Dehydration. ? Hyperthyroidism.  People who take certain medicines.  People who have had a major injury.  People who have had surgery.  What are the signs or symptoms? The most common symptom of this condition is lightheadedness after standing up from a lying down position. Other symptoms may include:  Feeling a rapid increase in the speed of the heartbeat (tachycardia) within 10 minutes of standing up.  Fainting.  Weakness.  Confusion.  Trembling.  Shortness of breath.  Sweating or flushing.  Headache.  Chest pain.  Breathing that is deeper and faster than normal (hyperventilation).  Nausea.  Anxiety.  Symptoms may be worse in the morning, and they may be relieved by lying down. How is this diagnosed? This condition is diagnosed based on:  Your symptoms.  Your medical history.  A physical exam.  Measurements of your heart rate when you are lying down and after you stand up.  A measurement of your blood pressure. The measurement will be taken when you go from lying down to standing up.  Blood tests to measure hormones that change with blood pressure. The blood tests will be done when you are  lying down and standing up.  You may have other tests to check whether you have a condition or disease that is linked to POTS. How is this treated? Treatment for this condition depends on how severe your symptoms are and whether you have any conditions or diseases that have been linked to POTS. Treatment may involve:  Treating any conditions or diseases that have been linked to POTS.  Drinking two glasses of water before getting up from a lying position.  Eating more salt (sodium).  Taking medicine to control blood pressure and heart rate (beta-blocker).  Taking medicines to control blood flow, blood pressure, or heart rate.  Avoiding certain medicines.  Starting an exercise program under the supervision of your health care provider.  Follow these instructions at home:  Eating and drinking  Drink enough fluid to keep your urine clear or pale yellow.  If told by your health care provider, drink two glasses of water before getting up from a lying position.  Follow instructions from your health care provider about how much sodium you should eat.  Eat several small meals a day instead of a few large meals.  Avoid heavy meals. Medicines  Take over-the-counter and prescription medicines only as told by your health care provider.  Talk with your health care provider before starting any new medicines. Activity  Do an aerobic exercise for 20 minutes a day, at least 3 days a week.  Ask your health care provider what kinds of exercise are safe for you. Contact a health care provider if:  Your symptoms  do not improve after treatment.  Your symptoms get worse.  You develop new symptoms. Get help right away if:  You have chest pain.  You have difficulty breathing.  You have fainting episodes. This information is not intended to replace advice given to you by your health care provider. Make sure you discuss any questions you have with your health care provider. Document  Released: 06/18/2002 Document Revised: 08/06/2015 Document Reviewed: 01/10/2015 Elsevier Interactive Patient Education  2018 [place] Breast self-awareness means:  Knowing how your breasts look.  Knowing how your breasts feel.  Checking your breasts every month for changes.  Telling your doctor if you notice a change in your breasts.  Breast self-awareness allows you to notice a breast problem early while it is still small. How to do a breast self-exam One way to learn what is normal for your breasts and to check for changes is to do a breast self-exam. To do a breast self-exam: Look for Changes  1. Take off all the clothes above your waist. 2. Stand in front of a mirror in a room with good lighting. 3. Put your hands on your hips. 4. Push your hands down. 5. Look at your breasts and nipples in the mirror to see if one breast or nipple looks different than the other. Check to see if: ? The shape of one breast is different. ? The size of one breast is different. ? There are wrinkles, dips, and bumps in one breast and not the other. 6. Look at each breast for changes in your skin, such as: ? Redness. ? Scaly areas. 7. Look for changes in your nipples, such as: ? Liquid around the nipples. ? Bleeding. ? Dimpling. ? Redness. ? A change in where the nipples are. Feel for Changes 1. Lie on your back on the floor. 2. Feel each breast. To do this, follow these steps: ? Pick a breast to feel. ? Put the arm closest to that breast above your head. ? Use your other arm to feel the nipple area of your breast. Feel the area with the pads of your three middle fingers by making small circles with your fingers. For the first circle, press lightly. For the second circle, press harder. For the third circle, press even harder. ? Keep making circles with your fingers at the light, harder, and even harder pressures as you move down your breast. Stop when you feel your  ribs. ? Move your fingers a little toward the center of your body. ? Start making circles with your fingers again, this time going up until you reach your collarbone. ? Keep making up and down circles until you reach your armpit. Remember to keep using the three pressures. ? Feel the other breast in the same way. 3. Sit or stand in the shower or tub. 4. With soapy water on your skin, feel each breast the same way you did in step 2, when you were lying on the floor. Write Down What You Find  After doing the self-exam, write down:  What is normal for each breast.  Any changes you find in each breast.  When you last had your period.  How often should I check my breasts? Check your breasts every month. If you are breastfeeding, the best time to check them is after you feed your baby or after you use a breast pump. If you get periods, the best time to check your breasts is 5-7 days after your period  is over. When should I see my doctor? See your doctor if you notice:  A change in shape or size of your breasts or nipples.  A change in the skin of your breast or nipples, such as red or scaly skin.  Unusual fluid coming from your nipples.  A lump or thick area that was not there before.  Pain in your breasts.  Anything that concerns you.  This information is not intended to replace advice given to you by your health care provider. Make sure you discuss any questions you have with your health care provider. Document Released: 12/15/2007 Document Revised: 12/04/2015 Document Reviewed: 05/18/2015 Elsevier Interactive Patient Education  2018 [place] Maintenance, Female Adopting a healthy lifestyle and getting preventive care can go a long way to promote health and wellness. Talk with your health care provider about what schedule of regular examinations is right for you. This is a good chance for you to check in with your provider about disease prevention and staying  healthy. In between checkups, there are plenty of things you can do on your own. Experts have done a lot of research about which lifestyle changes and preventive measures are most likely to keep you healthy. Ask your health care provider for more information. Weight and diet Eat a healthy diet  Be sure to include plenty of vegetables, fruits, low-fat dairy products, and lean protein.  Do not eat a lot of foods high in solid fats, added sugars, or salt.  Get regular exercise. This is one of the most important things you can do for your health. ? Most adults should exercise for at least 150 minutes each week. The exercise should increase your heart rate and make you sweat (moderate-intensity exercise). ? Most adults should also do strengthening exercises at least twice a week. This is in addition to the moderate-intensity exercise.  Maintain a healthy weight  Body mass index (BMI) is a measurement that can be used to identify possible weight problems. It estimates body fat based on height and weight. Your health care provider can help determine your BMI and help you achieve or maintain a healthy weight.  For females 43 years of age and older: ? A BMI below 18.5 is considered underweight. ? A BMI of 18.5 to 24.9 is normal. ? A BMI of 25 to 29.9 is considered overweight. ? A BMI of 30 and above is considered obese.  Watch levels of cholesterol and blood lipids  You should start having your blood tested for lipids and cholesterol at 30 years of age, then have this test every 5 years.  You may need to have your cholesterol levels checked more often if: ? Your lipid or cholesterol levels are high. ? You are older than 30 years of age. ? You are at high risk for heart disease.  Cancer screening Lung Cancer  Lung cancer screening is recommended for adults 42-57 years old who are at high risk for lung cancer because of a history of smoking.  A yearly low-dose CT scan of the lungs is  recommended for people who: ? Currently smoke. ? Have quit within the past 15 years. ? Have at least a 30-pack-year history of smoking. A pack year is smoking an average of one pack of cigarettes a day for 1 year.  Yearly screening should continue until it has been 15 years since you quit.  Yearly screening should stop if you develop a health problem that would prevent you from having lung  cancer treatment.  Breast Cancer  Practice breast self-awareness. This means understanding how your breasts normally appear and feel.  It also means doing regular breast self-exams. Let your health care provider know about any changes, no matter how small.  If you are in your 20s or 30s, you should have a clinical breast exam (CBE) by a health care provider every 1-3 years as part of a regular health exam.  If you are 57 or older, have a CBE every year. Also consider having a breast X-ray (mammogram) every year.  If you have a family history of breast cancer, talk to your health care provider about genetic screening.  If you are at high risk for breast cancer, talk to your health care provider about having an MRI and a mammogram every year.  Breast cancer gene (BRCA) assessment is recommended for women who have family members with BRCA-related cancers. BRCA-related cancers include: ? Breast. ? Ovarian. ? Tubal. ? Peritoneal cancers.  Results of the assessment will determine the need for genetic counseling and BRCA1 and BRCA2 testing.  Cervical Cancer Your health care provider may recommend that you be screened regularly for cancer of the pelvic organs (ovaries, uterus, and vagina). This screening involves a pelvic examination, including checking for microscopic changes to the surface of your cervix (Pap test). You may be encouraged to have this screening done every 3 years, beginning at age 28.  For women ages 18-65, health care providers may recommend pelvic exams and Pap testing every 3 years,  or they may recommend the Pap and pelvic exam, combined with testing for human papilloma virus (HPV), every 5 years. Some types of HPV increase your risk of cervical cancer. Testing for HPV may also be done on women of any age with unclear Pap test results.  Other health care providers may not recommend any screening for nonpregnant women who are considered low risk for pelvic cancer and who do not have symptoms. Ask your health care provider if a screening pelvic exam is right for you.  If you have had past treatment for cervical cancer or a condition that could lead to cancer, you need Pap tests and screening for cancer for at least 20 years after your treatment. If Pap tests have been discontinued, your risk factors (such as having a new sexual partner) need to be reassessed to determine if screening should resume. Some women have medical problems that increase the chance of getting cervical cancer. In these cases, your health care provider may recommend more frequent screening and Pap tests.  Colorectal Cancer  This type of cancer can be detected and often prevented.  Routine colorectal cancer screening usually begins at 30 years of age and continues through 30 years of age.  Your health care provider may recommend screening at an earlier age if you have risk factors for colon cancer.  Your health care provider may also recommend using home test kits to check for hidden blood in the stool.  A small camera at the end of a tube can be used to examine your colon directly (sigmoidoscopy or colonoscopy). This is done to check for the earliest forms of colorectal cancer.  Routine screening usually begins at age 66.  Direct examination of the colon should be repeated every 5-10 years through 30 years of age. However, you may need to be screened more often if early forms of precancerous polyps or small growths are found.  Skin Cancer  Check your skin from head to toe regularly.  Tell your  health care provider about any new moles or changes in moles, especially if there is a change in a mole's shape or color.  Also tell your health care provider if you have a mole that is larger than the size of a pencil eraser.  Always use sunscreen. Apply sunscreen liberally and repeatedly throughout the day.  Protect yourself by wearing long sleeves, pants, a wide-brimmed hat, and sunglasses whenever you are outside.  Heart disease, diabetes, and high blood pressure  High blood pressure causes heart disease and increases the risk of stroke. High blood pressure is more likely to develop in: ? People who have blood pressure in the high end of the normal range (130-139/85-89 mm Hg). ? People who are overweight or obese. ? People who are African American.  If you are 72-61 years of age, have your blood pressure checked every 3-5 years. If you are 68 years of age or older, have your blood pressure checked every year. You should have your blood pressure measured twice-once when you are at a hospital or clinic, and once when you are not at a hospital or clinic. Record the average of the two measurements. To check your blood pressure when you are not at a hospital or clinic, you can use: ? An automated blood pressure machine at a pharmacy. ? A home blood pressure monitor.  If you are between 64 years and 27 years old, ask your health care provider if you should take aspirin to prevent strokes.  Have regular diabetes screenings. This involves taking a blood sample to check your fasting blood sugar level. ? If you are at a normal weight and have a low risk for diabetes, have this test once every three years after 30 years of age. ? If you are overweight and have a high risk for diabetes, consider being tested at a younger age or more often. Preventing infection Hepatitis B  If you have a higher risk for hepatitis B, you should be screened for this virus. You are considered at high risk for  hepatitis B if: ? You were born in a country where hepatitis B is common. Ask your health care provider which countries are considered high risk. ? Your parents were born in a high-risk country, and you have not been immunized against hepatitis B (hepatitis B vaccine). ? You have HIV or AIDS. ? You use needles to inject street drugs. ? You live with someone who has hepatitis B. ? You have had sex with someone who has hepatitis B. ? You get hemodialysis treatment. ? You take certain medicines for conditions, including cancer, organ transplantation, and autoimmune conditions.  Hepatitis C  Blood testing is recommended for: ? Everyone born from [birth year] through 1964. ? Anyone with known risk factors for hepatitis C.  Sexually transmitted infections (STIs)  You should be screened for sexually transmitted infections (STIs) including gonorrhea and chlamydia if: ? You are sexually active and are younger than 30 years of age. ? You are older than 30 years of age and your health care provider tells you that you are at risk for this type of infection. ? Your sexual activity has changed since you were last screened and you are at an increased risk for chlamydia or gonorrhea. Ask your health care provider if you are at risk.  If you do not have HIV, but are at risk, it may be recommended that you take a prescription medicine daily to prevent HIV infection. This is called  pre-exposure prophylaxis (PrEP). You are considered at risk if: ? You are sexually active and do not regularly use condoms or know the HIV status of your partner(s). ? You take drugs by injection. ? You are sexually active with a partner who has HIV.  Talk with your health care provider about whether you are at high risk of being infected with HIV. If you choose to begin PrEP, you should first be tested for HIV. You should then be tested every 3 months for as long as you are taking PrEP. Pregnancy  If you are premenopausal and you may  become pregnant, ask your health care provider about preconception counseling.  If you may become pregnant, take 400 to 800 micrograms (mcg) of folic acid every day.  If you want to prevent pregnancy, talk to your health care provider about birth control (contraception). Osteoporosis and menopause  Osteoporosis is a disease in which the bones lose minerals and strength with aging. This can result in serious bone fractures. Your risk for osteoporosis can be identified using a bone density scan.  If you are 79 years of age or older, or if you are at risk for osteoporosis and fractures, ask your health care provider if you should be screened.  Ask your health care provider whether you should take a calcium or vitamin D supplement to lower your risk for osteoporosis.  Menopause may have certain physical symptoms and risks.  Hormone replacement therapy may reduce some of these symptoms and risks. Talk to your health care provider about whether hormone replacement therapy is right for you. Follow these instructions at home:  Schedule regular health, dental, and eye exams.  Stay current with your immunizations.  Do not use any tobacco products including cigarettes, chewing tobacco, or electronic cigarettes.  If you are pregnant, do not drink alcohol.  If you are breastfeeding, limit how much and how often you drink alcohol.  Limit alcohol intake to no more than 1 drink per day for nonpregnant women. One drink equals 12 ounces of beer, 5 ounces of wine, or 1 ounces of hard liquor.  Do not use street drugs.  Do not share needles.  Ask your health care provider for help if you need support or information about quitting drugs.  Tell your health care provider if you often feel depressed.  Tell your health care provider if you have ever been abused or do not feel safe at home. This information is not intended to replace advice given to you by your health care provider. Make sure you  discuss any questions you have with your health care provider. Document Released: 01/11/2011 Document Revised: 12/04/2015 Document Reviewed: 04/01/2015 Elsevier Interactive Patient Education  [employer].

## 2017-03-18 NOTE — Progress Notes (Signed)
Subjective:     Patient ID: Catherine Mendez, female   DOB: May 27, 1986, 31 y.o.   MRN: [id number]  HPI  Patient is a 31 year old female with in  No acute distress  who presents to the office for wellness exam.   She saw a friend who is a physical therapist- she reports she was able to help her with the pain she was having on her right side that she was seen for at a previous appointment- all x-rays done at that time were negative.   She saw neurology for Dr. Clelia Croft and was prescribed Butterbur and Imitrex for Migraines  And he also  ordered an MRI  and nerve study for tingling in hands per patient.   For her Postural Orthostatic tachycardia syndrome  - she denies any flares at this time. Sees Dr Graciela Husbands regularly  Patient Active Problem List   Diagnosis Date Noted  . POTS (postural orthostatic tachycardia syndrome) 11/05/2016     Current Outpatient Prescriptions:  .  midodrine (PROAMATINE) 2.5 MG tablet, Take 1-2 tablets (2.5 mg- 5 mg) three times a day at 7 am, 11 am, & 3 pm as directed, Disp: 540 tablet, Rfl: 3 .  midodrine (PROAMATINE) 2.5 MG tablet, Take by mouth., Disp: , Rfl:  .  Vitamin D, Ergocalciferol, (DRISDOL) 50000 units CAPS capsule, Take 1 capsule (50,000 Units total) by mouth every 7 (seven) days., Disp: 8 capsule, Rfl: 0 .  Azelastine-Fluticasone (DYMISTA NA), Place into the nose., Disp: , Rfl:  .  pantoprazole (PROTONIX) 40 MG tablet, Take by mouth., Disp: , Rfl:  .  propranolol (INDERAL) 20 MG tablet, Take one tablet (20 mg) by mouth twice daily as needed for fast heart rates/ palpitations (Patient not taking: Reported on 03/18/2017), Disp: 60 tablet, Rfl: 6 .  Vitamin D, Ergocalciferol, (DRISDOL) 50000 units CAPS capsule, Take by mouth., Disp: , Rfl:    LMP - 02/19/2017  Regular menses.   Eye Doctor- she sees yearly  [facility]- wears contacts for near and far vision. Needs Gynecology referral - saw last 2017 . - requests female.    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 Altamese Piedmont - [facility].  She denies any changing skin lesions or rash.  She denies any recent illness.  She reports her bones are feeling better since taking the Vitamin D the past three weeks- she has not felt sore like she  once did. She has future lab ordered to recheck Vitamin D level.   Vitals:   03/18/17 1106  BP: 90/64  Pulse: 85  Temp: 98.8 F (37.1 C)  SpO2: 99%   Vitals:   03/18/17 1106  BP: 90/64  Pulse: 85  Temp: 98.8 F (37.1 C)  SpO2: 99%     Review of Systems  Constitutional: Negative.  Negative for activity change.  Eyes: Negative.   Respiratory: Positive for shortness of breath (only when medication wears off for POTS). Negative for apnea, cough, choking, chest tightness, wheezing and stridor.   Cardiovascular: Negative.   Gastrointestinal: Negative.   Endocrine: Negative.   Genitourinary: Negative.   Musculoskeletal: Positive for arthralgias (right side see previous visit ).  Skin: Negative.   Allergic/Immunologic: Negative.   Neurological: Negative.   Hematological: Negative.   Psychiatric/Behavioral: Negative.        Objective:   Physical Exam  Constitutional: She is oriented to person,  place, and time. Vital signs are normal. She appears well-developed and well-nourished. She is active. No distress.  Petite body habitus  POTS  History and  blood pressure runs around what it is today.  HENT:  Head: Normocephalic and atraumatic.  Right Ear: External ear normal.  Left Ear: External ear normal.  Nose: Nose normal.  Mouth/Throat: Oropharynx is clear and moist. No oropharyngeal exudate.  Eyes: Pupils are equal, round, and reactive to light. Conjunctivae and EOM are normal. Right eye exhibits no discharge. Left eye exhibits no discharge. No scleral icterus.  Neck: Normal range of motion. Neck supple. No JVD present. No  tracheal deviation present. No thyromegaly present.  Cardiovascular: Normal rate, regular rhythm and intact distal pulses.  Exam reveals no gallop and no friction rub.   No murmur heard. Pulmonary/Chest: Effort normal. No stridor. No respiratory distress. She has no wheezes. She has no rales. She exhibits no tenderness.  Abdominal: Soft. Bowel sounds are normal. She exhibits no distension and no mass. There is no tenderness. There is no rebound and no guarding.  Genitourinary:  Genitourinary Comments: Patient deferred bilateral breast exam today. She does reports she does self breast exams and denies any nipple discharge or any changes in bilateral breast. She will see GYN for breast and pelvic exam.  No pelvic exam set up in this [facility].  Referred to GYN.  Musculoskeletal: Normal range of motion. She exhibits no edema, tenderness or deformity.  Patient moves on and off of exam table and in room without difficulty. Gait is normal in hall and in room. Patient is oriented to person place time and situation. Patient answers questions appropriately and engages in conversation.   Lymphadenopathy:    She has no cervical adenopathy.  Neurological: She is alert and oriented to person, place, and time. She has normal reflexes. She displays normal reflexes. No cranial nerve deficit. She exhibits normal muscle tone. Coordination normal.  Skin: Skin is warm and dry. No rash noted. She is not diaphoretic. No cyanosis or erythema. No pallor. Nails show no clubbing.  Psychiatric: She has a normal mood and affect. Her behavior is normal. Judgment and thought content normal.  Vitals reviewed.      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 VITAMIN D 25 Hydroxy (Vit-D Deficiency, Fractures)    Standing Status:   Future    Standing Expiration Date:   07/18/2017  . Ambulatory referral to Hematology    Referral Priority:   Routine    Referral Type:   Consultation    Referral Reason:   Specialty Services Required    Referred to Provider:   Jeralyn Ruths, MD    Requested Specialty:   Oncology    Number of Visits Requested:   1  . Ambulatory referral to Obstetrics / Gynecology    Referral Priority:   Routine    Referral Type:   Consultation    Referral Reason:   Specialty Services Required    Referred to Provider:   Marcelle Overlie, MD    Requested Specialty:   Obstetrics and  Gynecology    Number of Visits Requested:   1   Recheck Vitamin D as discussed in two to three months.  Health maintenance hand out provided.   Return to clinic at any time- call office for an appointment -   if any new symptoms develop  change, worsen or do not improve. Be seen in urgent care/emergency room  if clinic is closed. If any emergent symptoms call 911 at anytime. Patient verbalized above understanding of all instructions and denies any further questions at this time.    Patient verbalized understanding of instructions and denies any further questions at this time.

## 2017-03-29 ENCOUNTER — Telehealth: Payer: Self-pay | Admitting: Radiology

## 2017-03-29 NOTE — Telephone Encounter (Signed)
Left voicemail on cell phone for patient to call [id number] in regards to the referral made to this office for Routine adult health maintenance and Factor VII deficiency (HCC). Asked patient to call back to scheduled appointment

## 2017-04-01 ENCOUNTER — Other Ambulatory Visit: Payer: Self-pay | Admitting: Neurology

## 2017-04-01 DIAGNOSIS — G43719 Chronic migraine without aura, intractable, without status migrainosus: Secondary | ICD-10-CM

## 2017-04-10 DIAGNOSIS — D682 Hereditary deficiency of other clotting factors: Secondary | ICD-10-CM | POA: Insufficient documentation

## 2017-04-10 NOTE — Progress Notes (Signed)
[facility]  [phone] [phone]  ID: Catherine Mendez OB: [birth year]  MR#: [id number]  OVF#:[id number]  Patient Care Team: Berniece Pap, FNP as PCP - General (Family Medicine)  CHIEF COMPLAINT: Factor XII deficiency.  INTERVAL HISTORY: Patient is a 31 year old female who was diagnosed with factor XII deficiency and has recently moved to [place] to establish care. She currently feels well and is at her baseline. She has a persistently elevated PTT, but denies any problems with bleeding. She has had dental procedures without a problem. She did have an epidural with the birth of her daughter 2 years ago and states she received FFP for this procedure. She had no complications with her birth. She has no neurologic complaints. She denies any recent fevers or illnesses. She has a good appetite and denies weight loss. She has no chest pain or shortness of breath. She denies any nausea, vomiting, constipation, or diarrhea. She has no melena or hematochezia. She has no urinary complaints. Patient feels at her baseline and offers no specific complaints today.  REVIEW OF SYSTEMS:   Review of Systems  Constitutional: Negative.  Negative for fever, malaise/fatigue and weight loss.  Respiratory: Negative.  Negative for cough and shortness of breath.   Cardiovascular: Negative.  Negative for chest pain and leg swelling.  Gastrointestinal: Negative.  Negative for abdominal pain, blood in stool and melena.  Genitourinary: Negative.  Negative for hematuria.  Musculoskeletal: Negative.   Skin: Negative.  Negative for rash.  Neurological: Negative.  Negative for weakness.  Endo/Heme/Allergies: Does not bruise/bleed easily.  Psychiatric/Behavioral: Negative.  The patient is not nervous/anxious.     As per HPI. Otherwise, a complete review of systems is negative.  PAST MEDICAL HISTORY: Past Medical History:  Diagnosis Date  . Anemia    in past   .  Anxiety    medicine in college for deopression /anxiety   . Blood transfusion without reported diagnosis    2 with childbirth/ choleithias with pregnancy  . Clotting disorder (HCC)   . Factor XII deficiency (HCC)   . Gestational diabetes 2016  . POTS (postural orthostatic tachycardia syndrome)     PAST SURGICAL HISTORY: Past Surgical History:  Procedure Laterality Date  . COSMETIC SURGERY     dental implant   . MOUTH SURGERY  2013   bottom dental implant     FAMILY HISTORY: Family History  Problem Relation Age of Onset  . Clotting disorder Mother   . Bleeding Disorder Mother   . Fibromyalgia Mother   . Hypertension Father   . Clotting disorder Sister   . Factor VIII deficiency Sister   . Bleeding Disorder Sister   . Aneurysm Maternal Aunt   . Colon cancer Maternal Grandmother   . Lung cancer Maternal Grandmother   . Colon cancer Maternal Grandfather   . Pancreatic cancer Paternal Grandmother   . Colon cancer Paternal Grandmother   . Heart disease Paternal Grandfather     ADVANCED DIRECTIVES (Y/N):  N  HEALTH MAINTENANCE: Social History  Substance Use Topics  . Smoking status: Never Smoker  . Smokeless tobacco: Never Used  . Alcohol use Yes     Comment: wine occassionally     Colonoscopy:  PAP:  Bone density:  Lipid panel:  Allergies  Allergen Reactions  . Iodine   . Iodinated Diagnostic Agents Rash  . Shellfish Allergy Rash    Current Outpatient Prescriptions  Medication Sig Dispense Refill  . Azelastine-Fluticasone (DYMISTA NA) Place into  the nose.    . midodrine (PROAMATINE) 2.5 MG tablet Take 1-2 tablets (2.5 mg- 5 mg) three times a day at 7 am, 11 am, & 3 pm as directed 540 tablet 3  . pantoprazole (PROTONIX) 40 MG tablet Take by mouth.    . propranolol (INDERAL) 20 MG tablet Take one tablet (20 mg) by mouth twice daily as needed for fast heart rates/ palpitations 60 tablet 6  . Vitamin D, Ergocalciferol, (DRISDOL) 50000 units CAPS capsule Take 1  capsule (50,000 Units total) by mouth every 7 (seven) days. 8 capsule 0   No current facility-administered medications for this visit.     OBJECTIVE: Vitals:   04/12/17 1142  BP: 109/75  Pulse: (!) 102  Resp: 18  Temp: 97.7 F (36.5 C)     Body mass index is 20.22 kg/m.    ECOG FS:0 - Asymptomatic  General: Well-developed, well-nourished, no acute distress. Eyes: Pink conjunctiva, anicteric sclera. HEENT: Normocephalic, moist mucous membranes, clear oropharnyx. Lungs: Clear to auscultation bilaterally. Heart: Regular rate and rhythm. No rubs, murmurs, or gallops. Abdomen: Soft, nontender, nondistended. No organomegaly noted, normoactive bowel sounds. Musculoskeletal: No edema, cyanosis, or clubbing. Neuro: Alert, answering all questions appropriately. Cranial nerves grossly intact. Skin: No rashes or petechiae noted. Psych: Normal affect. Lymphatics: No cervical, calvicular, axillary or inguinal LAD.   LAB RESULTS:  Lab Results  Component Value Date   NA 139 01/28/2017   K 4.1 01/28/2017   CL 102 01/28/2017   CO2 27 01/04/2017   GLUCOSE 80 01/28/2017   BUN 8 01/28/2017   CREATININE 0.69 01/28/2017   CALCIUM 9.3 01/28/2017   PROT 6.8 01/28/2017   ALBUMIN 4.1 01/28/2017   AST 28 01/28/2017   ALT 27 01/28/2017   ALKPHOS 94 01/28/2017   BILITOT 0.6 01/28/2017   GFRNONAA 118 01/28/2017   GFRAA 136 01/28/2017    Lab Results  Component Value Date   WBC 5.5 01/28/2017   NEUTROABS 1.7 01/28/2017   HGB 12.3 01/28/2017   HCT 37.5 01/28/2017   MCV 91 01/28/2017   PLT 223 01/28/2017     STUDIES: No results found.  ASSESSMENT: Factor XII deficiency  PLAN:    1. 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 Confirmatory tests from today is pending. This is inherited as an autosomal recessive trait. Patient's have a marked prolongation of her PTT, but did not exhibit any bleeding diathesis. She does not require  any treatment. She is not at risk for excessive bleeding over the general population. She did have FFP given to her prior to an epidural 2 years ago in [place] for the birth of her child, but did not have any excessive bleeding with the epidural or with the birth of her child. No intervention is needed at this time. No follow-up is necessary.  Approximately 60 minutes spent in discussion of which greater than 50% was consultation.   Patient expressed understanding and was in agreement with this plan. She also understands that She can call clinic at any time with any questions, concerns, or complaints.    Jeralyn Ruths, MD   04/13/2017 3:19 PM

## 2017-04-12 ENCOUNTER — Inpatient Hospital Stay: Payer: BLUE CROSS/BLUE SHIELD | Attending: Oncology | Admitting: Oncology

## 2017-04-12 ENCOUNTER — Inpatient Hospital Stay: Payer: BLUE CROSS/BLUE SHIELD

## 2017-04-12 ENCOUNTER — Telehealth: Payer: Self-pay

## 2017-04-12 VITALS — BP 109/75 | HR 102 | Temp 97.7°F | Resp 18 | Wt 100.1 lb

## 2017-04-12 DIAGNOSIS — Z79899 Other long term (current) drug therapy: Secondary | ICD-10-CM | POA: Insufficient documentation

## 2017-04-12 DIAGNOSIS — F419 Anxiety disorder, unspecified: Secondary | ICD-10-CM | POA: Diagnosis not present

## 2017-04-12 DIAGNOSIS — D682 Hereditary deficiency of other clotting factors: Secondary | ICD-10-CM

## 2017-04-12 LAB — APTT

## 2017-04-12 LAB — PROTIME-INR
INR: 1.06
Prothrombin Time: 13.7 seconds (ref 11.4–15.2)

## 2017-04-12 NOTE — Telephone Encounter (Signed)
[facility] hospital lab called with critical PTT results on patient.  Dr. Orlie Dakin notified of results.  No further orders received.

## 2017-04-12 NOTE — Progress Notes (Signed)
Patient here today for new consultation regarding Factor 12 deficiency, denies any concerns today.

## 2017-04-14 NOTE — Progress Notes (Signed)
Thank you for consulting this patient in care.   Marcelino Duster Barrett Holthaus FNP-C

## 2017-04-15 ENCOUNTER — Ambulatory Visit
Admission: RE | Admit: 2017-04-15 | Discharge: 2017-04-15 | Disposition: A | Discharge status: Home / Self Care | Payer: BLUE CROSS/BLUE SHIELD | Source: Ambulatory Visit | Attending: Neurology | Admitting: Neurology

## 2017-04-15 DIAGNOSIS — G43719 Chronic migraine without aura, intractable, without status migrainosus: Secondary | ICD-10-CM

## 2017-04-15 MED ORDER — GADOBENATE DIMEGLUMINE 529 MG/ML IV SOLN
10.0000 mL | Freq: Once | INTRAVENOUS | Status: AC | PRN
  Administered 2017-04-15: 9 mL via INTRAVENOUS

## 2017-04-17 LAB — FACTOR 12 ASSAY: Factor XII Activity: <1 % — ABNORMAL LOW (ref 50–150)

## 2017-04-27 ENCOUNTER — Telehealth: Payer: Self-pay

## 2017-04-28 NOTE — Telephone Encounter (Signed)
Follow up call to be sure patient had Prescription for Vitamin D 50,000 units for 8 weeks filled.  Pt is to start 2,000 units IU Vitamin D3 daily (OTC)  after completion of Vitamin D 50,000 units.  Pt verbalizes understanding and has already picked up the prescription.

## 2017-05-11 NOTE — Telephone Encounter (Signed)
Left generic message on 05/11/17 for patient to return call to the office.   Provider has appointment with patient on 05/12/17 and says scheduled for for to be signed off for study.   Would like specific details on program study as patient sees specialist including neurology, and hematology for Factor VII. Also noted upcoming liver biopsy in [facility].

## 2017-05-12 ENCOUNTER — Ambulatory Visit: Payer: Self-pay | Admitting: Adult Health

## 2017-05-20 ENCOUNTER — Encounter: Payer: Self-pay | Admitting: Internal Medicine

## 2017-05-20 ENCOUNTER — Other Ambulatory Visit: Payer: Self-pay

## 2017-05-20 DIAGNOSIS — E559 Vitamin D deficiency, unspecified: Secondary | ICD-10-CM

## 2017-05-21 LAB — VITAMIN D 25 HYDROXY (VIT D DEFICIENCY, FRACTURES): Vit D, 25-Hydroxy: 41.1 ng/mL (ref 30.0–100.0)

## 2017-05-25 ENCOUNTER — Telehealth: Payer: Self-pay

## 2017-05-25 NOTE — Telephone Encounter (Signed)
Per instructions from M. Flinchum NP, called patient and advised Vit D levels were now within normal limits.  Instructed her to continuing taking Vit D at 332000 iu/daily with food.  Patient was pleased with results and will continue with supplement at maintenance levels.  Advised to recheck in 6 months to 1 years.

## 2017-05-26 ENCOUNTER — Ambulatory Visit: Payer: Self-pay | Admitting: Adult Health

## 2017-05-26 ENCOUNTER — Encounter: Payer: Self-pay | Admitting: Adult Health

## 2017-05-26 VITALS — BP 90/60 | HR 84 | Temp 98.2°F | Resp 16 | Wt 102.0 lb

## 2017-05-26 DIAGNOSIS — J019 Acute sinusitis, unspecified: Secondary | ICD-10-CM

## 2017-05-26 DIAGNOSIS — F411 Generalized anxiety disorder: Secondary | ICD-10-CM

## 2017-05-26 MED ORDER — SERTRALINE HCL 25 MG PO TABS: 25.0000 mg | ORAL_TABLET | Freq: Every day | ORAL | 0 refills | Status: DC

## 2017-05-26 MED ORDER — AMOXICILLIN-POT CLAVULANATE 500-125 MG PO TABS: 1.0000 | ORAL_TABLET | Freq: Three times a day (TID) | ORAL | 0 refills | Status: AC

## 2017-05-26 NOTE — Progress Notes (Signed)
Subjective:     Patient ID: Catherine Mendez, female   DOB: 08-17-1985, 31 y.o.   MRN: [id number]  Sinusitis  This is a new (started 05/08/17 ) problem. The current episode started 1 to 4 weeks ago (Dymista using ). The problem has been gradually worsening since onset. There has been no fever. Pain scale: face / bilateral ears  Associated symptoms include congestion, coughing, ear pain, a hoarse voice, sinus pressure and sneezing. Pertinent negatives include no chills, diaphoresis, headaches (migraine with ora 3 weeks ago that resolved. ), neck pain, shortness of breath, sore throat or swollen glands. Treatments tried: Dymista and Alka Seltzer  Coild  The treatment provided mild relief.   She denies any sinusitis in the past 6 months.  Patient  denies any fever, chills, rash, chest pain, shortness of breath, nausea, vomiting, or diarrhea.  She also reports she is struggling with depression. She is having good days and bad days. She reports " its just not going away" . She reports she has started exercising some. She report she noticed this gets worse when work slows down.She denies any suicidal or homicidal ideations.She reports she has had depression in the past and took Zoloft in college. She reports she usually feels more " sad" during the holidays and feels this worsening.   Blood pressure 90/60, pulse 84, temperature 98.2 F (36.8 C), resp. rate 16, weight 102 lb (46.3 kg), last menstrual period 05/11/2017, SpO2 98 %.  Patient's last menstrual period was 05/11/2017.   Review of Systems  Constitutional: Negative for chills and diaphoresis.  HENT: Positive for congestion, ear pain, hoarse voice, sinus pressure and sneezing. Negative for sore throat.   Eyes: Negative.   Respiratory: Positive for cough. Negative for shortness of breath.   Cardiovascular: Negative for chest pain, palpitations and leg swelling.  Gastrointestinal: Negative.        No pain if taking Protonix.   Genitourinary:  Negative.   Musculoskeletal: Negative for neck pain.  Skin: Negative.   Neurological: Negative for syncope (None currenty she did have a few near syncopal episodes while traveling in October to  [facility] and will report these to her specialist Dr. Marikay Alar . Denies any since. ) and headaches (migraine with ora 3 weeks ago that resolved. ).  Hematological: Negative.   Psychiatric/Behavioral: Negative for agitation, behavioral problems, confusion, decreased concentration, dysphoric mood, hallucinations, self-injury, sleep disturbance and suicidal ideas. The patient is not nervous/anxious and is not hyperactive.        Anxiety / depression was mild in August but increasing gradually and she desires to try medication as she took previously in college. Denies any suicidal or homicidal ideation.        Objective:   Physical Exam  Constitutional: She is oriented to person, place, and time. She appears well-developed and well-nourished. She is active. No distress. She is not intubated.  HENT:  Head: Normocephalic and atraumatic.  Right Ear: Hearing, external ear and ear canal normal. No drainage or tenderness. No mastoid tenderness. Tympanic membrane is not perforated and not erythematous. A middle ear effusion is present.  Left Ear: Hearing, external ear and ear canal normal. No drainage or tenderness. No mastoid tenderness. Tympanic membrane is not perforated and not erythematous. A middle ear effusion is present.  Nose: Mucosal edema and rhinorrhea present. Right sinus exhibits maxillary sinus tenderness. Right sinus exhibits no frontal sinus tenderness. Left sinus exhibits maxillary sinus tenderness and frontal sinus tenderness.  Mouth/Throat: Uvula is midline and mucous  membranes are normal. Uvula swelling present. Posterior oropharyngeal erythema present. No oropharyngeal exudate or posterior oropharyngeal edema.  Eyes: Conjunctivae, EOM and lids are normal. Pupils are equal, round, and reactive to  light. Right eye exhibits no discharge and no exudate. Left eye exhibits no discharge and no exudate. Right conjunctiva is not injected. Right conjunctiva has no hemorrhage. Left conjunctiva is not injected. Left conjunctiva has no hemorrhage. No scleral icterus.  Neck: Trachea normal, normal range of motion, full passive range of motion without pain and phonation normal. Neck supple. Normal carotid pulses, no hepatojugular reflux and no JVD present. No tracheal tenderness, no spinous process tenderness and no muscular tenderness present. Carotid bruit is not present. No neck rigidity. No tracheal deviation, no edema, no erythema and normal range of motion present. No Brudzinski's sign and no Kernig's sign noted. No thyroid mass and no thyromegaly present.  Cardiovascular: Normal rate, regular rhythm, S1 normal, S2 normal, normal heart sounds and intact distal pulses. Exam reveals no gallop, no distant heart sounds and no friction rub.  No murmur heard. Pulmonary/Chest: Effort normal and breath sounds normal. No accessory muscle usage or stridor. No apnea, no tachypnea and no bradypnea. She is not intubated. No respiratory distress. She has no wheezes. She has no rales. She exhibits no tenderness.  Abdominal: Soft. Normal aorta and bowel sounds are normal. She exhibits no shifting dullness, no distension, no pulsatile liver, no fluid wave, no abdominal bruit, no ascites and no pulsatile midline mass. There is no hepatosplenomegaly, splenomegaly or hepatomegaly. There is no tenderness. There is no CVA tenderness. No hernia.  Musculoskeletal: Normal range of motion. She exhibits no edema, tenderness or deformity.  Lymphadenopathy:       Head (right side): No submental, no submandibular, no tonsillar, no preauricular, no posterior auricular and no occipital adenopathy present.       Head (left side): No submental, no submandibular, no tonsillar, no preauricular, no posterior auricular and no occipital  adenopathy present.    She has no cervical adenopathy.       Right cervical: No superficial cervical, no deep cervical and no posterior cervical adenopathy present.      Left cervical: No superficial cervical, no deep cervical and no posterior cervical adenopathy present.    She has no axillary adenopathy.       Right axillary: No pectoral and no lateral adenopathy present.       Left axillary: No pectoral and no lateral adenopathy present.      Right: No supraclavicular adenopathy present.       Left: No supraclavicular adenopathy present.  Neurological: She is alert and oriented to person, place, and time. She has normal strength and normal reflexes. She displays no atrophy and no tremor. No cranial nerve deficit or sensory deficit. She exhibits normal muscle tone. She displays a negative Romberg sign. She displays no seizure activity. Coordination and gait normal. GCS eye subscore is 4. GCS verbal subscore is 5. GCS motor subscore is 6.  Reflex Scores:      Tricep reflexes are 2+ on the right side and 2+ on the left side.      Bicep reflexes are 2+ on the right side and 2+ on the left side.      Brachioradialis reflexes are 2+ on the right side and 2+ on the left side.      Patellar reflexes are 2+ on the right side and 2+ on the left side.      Achilles reflexes  are 2+ on the right side and 2+ on the left side. Skin: Skin is warm, dry and intact. No abrasion, no bruising, no burn, no ecchymosis, no lesion, no petechiae and no rash noted. She is not diaphoretic. No cyanosis or erythema. No pallor. Nails show no clubbing.  Psychiatric: She has a normal mood and affect. Her speech is normal and behavior is normal. Judgment and thought content normal. Cognition and memory are normal.  Tearful when speaking about anxiety and depression, she does laugh and smile interacting as in previous visits.        Assessment:     Acute non-recurrent sinusitis, unspecified location  GAD (generalized  anxiety disorder)     Plan:     Meds ordered this encounter  Medications  . amoxicillin-clavulanate (AUGMENTIN) 500-125 MG tablet    Sig: Take 1 tablet (500 mg total) 3 (three) times daily for 10 days by mouth.    Dispense:  30 tablet    Refill:  0  . sertraline (ZOLOFT) 25 MG tablet    Sig: Take 1 tablet (25 mg total) daily by mouth.    Dispense:  30 tablet    Refill:  0   Zoloft and Augmentin prescribed as above.   Return in 2 weeks for follow up on Zoloft and Depression/ Anxiety.  She ws given [employer] resources sheet for counselors and verbalizes she will call today for counseling appointment.  She also understands [employer] Warning of Zoloft and will call office or go to Urgent Care/ Emergency room or Call 911 as needed.  For sinusitis:  Return to clinic at any time  if any new symptoms change, worsen or do not improve. Symptoms should improve  within 72 hours and if not improving you should call for an appointment at the clinic or if worsening symptoms be seen in urgent care/emergency room  if clinic is closed. If any emergent symptoms call 911 at anytime. Patient verbalized above understanding of all instructions and denies any further questions at this time.   Will recheck Liver enzymes every three months- scheduled for 06/09/17 or after currently.  Provider thoroughly discussed in collaboration above and patients history of elevated liver enzymes that have returned to normal and plan with supervising physician Dr. Julieanne Mansonichard Gilbert who is in agreement with the care plan as above.

## 2017-05-26 NOTE — Patient Instructions (Signed)
Seasonal Affective Disorder Seasonal affective disorder (SAD) is a form of depression. It is when you feel sad, down, or blue at specific times of the year. The most common time of year for this is late fall and winter, when the days are shorter and most people spend less time outdoors. This is why SAD is also known as the "winter blues." SAD occurs less commonly in the spring or summer. SAD can vary in severity and can interfere with work, school, relationships, and normal daily activities. What increases the risk? This condition is more common in:  [place] women.  People who have a history of clinical depression or bipolar disorder.  People who live far [place] or far [place] of the equator.  What are the signs or symptoms? Symptoms of this condition include:  Depressed mood, such as: ? Feeling sad, down, blue, or teary. ? Having crying spells.  Irritability.  Trouble sleeping or sleeping more than usual.  Loss of interest in activities that you usually enjoy.  Feelings of guilt or worthlessness.  Restlessness or loss of energy.  Difficulty concentrating, remembering, or making decisions.  Significant change in appetite or weight.  Recurrent wishes for death, recurrent thoughts of self-harm, or an attempt at suicide.  How is this diagnosed? Diagnosis of this condition is usually made through an assessment by your health care provider. You will be asked about your moods, thoughts, and behaviors. You will also be asked about your medical history, any major life changes, medicines, and substance use. A physical exam and lab work may be done to make sure there is no other cause for your depression. You may be referred to a mental health specialist for further evaluation. How is this treated? Treatment for this condition may include:  Light therapy. Light therapy involves sitting in front of a light source for 15-30 minutes every day. It is thought to work by increasing the duration of  daylight that is sensed by the brain.  Antidepressant medicine.  Cognitive behavioral therapy (CBT).CBT is a form of talk therapy that helps to identify and change negative thoughts that are associated with SAD.  Follow these instructions at home:  Take over-the-counter and prescription medicines only as told by your healthcare provider. This is important.  Check with your health care provider before you start taking any new prescriptions, over-the-counter medicines, herbs, or supplements.  Keep all follow-up visits as told by your health care provider.This is important.  Maintain a healthy lifestyle. ? Eat healthy foods. ? Get plenty of sleep. ? Exercise regularly. ? Do not drink alcohol. ? Do not use recreational drugs.  Make your home and work environment as sunny or bright as possible. Open blinds and spend as much time as possible outside. Contact a health care provider if:  Your medicines do not seem to be helping.  Your symptoms do not improve or they get worse.  You have trouble taking care of yourself.  You experience side effects of medicines, such as changes in sleep patterns, dizziness, drowsiness, weight gain, restlessness, movement changes, or tremors. Get help right away if:  You have serious thoughts about hurting yourself or others.  You experience serious side effects of medicine, such as: ? Swelling of your face, lips, tongue, or throat. ? Fever, confusion, muscle spasms, or seizures. This information is not intended to replace advice given to you by your health care provider. Make sure you discuss any questions you have with your health care provider. Document Released: 03/23/2001 Document  Revised: 12/04/2015 Document Reviewed: 07/02/2014 Elsevier Interactive Patient Education  2018 [employer] Sinusitis, Adult Sinusitis is soreness and inflammation of your sinuses. Sinuses are hollow spaces in the bones around your face. They are located:  Around  your eyes.  In the middle of your forehead.  Behind your nose.  In your cheekbones.  Your sinuses and nasal passages are lined with a stringy fluid (mucus). Mucus normally drains out of your sinuses. When your nasal tissues get inflamed or swollen, the mucus can get trapped or blocked so air cannot flow through your sinuses. This lets bacteria, viruses, and funguses grow, and that leads to infection. Follow these instructions at home: Medicines  Take, use, or apply over-the-counter and prescription medicines only as told by your doctor. These may include nasal sprays.  If you were prescribed an antibiotic medicine, take it as told by your doctor. Do not stop taking the antibiotic even if you start to feel better. Hydrate and Humidify  Drink enough water to keep your pee (urine) clear or pale yellow.  Use a cool mist humidifier to keep the humidity level in your home above 50%.  Breathe in steam for 10-15 minutes, 3-4 times a day or as told by your doctor. You can do this in the bathroom while a hot shower is running.  Try not to spend time in cool or dry air. Rest  Rest as much as possible.  Sleep with your head raised (elevated).  Make sure to get enough sleep each night. General instructions  Put a warm, moist washcloth on your face 3-4 times a day or as told by your doctor. This will help with discomfort.  Wash your hands often with soap and water. If there is no soap and water, use hand sanitizer.  Do not smoke. Avoid being around people who are smoking (secondhand smoke).  Keep all follow-up visits as told by your doctor. This is important. Contact a doctor if:  You have a fever.  Your symptoms get worse.  Your symptoms do not get better within 10 days. Get help right away if:  You have a very bad headache.  You cannot stop throwing up (vomiting).  You have pain or swelling around your face or eyes.  You have trouble seeing.  You feel confused.  Your  neck is stiff.  You have trouble breathing. This information is not intended to replace advice given to you by your health care provider. Make sure you discuss any questions you have with your health care provider. Document Released: 12/15/2007 Document Revised: 02/22/2016 Document Reviewed: 04/23/2015 Elsevier Interactive Patient Education  [employer]2018 Elsevier Inc.

## 2017-06-06 ENCOUNTER — Telehealth: Payer: Self-pay | Admitting: Internal Medicine

## 2017-06-06 NOTE — Telephone Encounter (Signed)
No answer. Left message to call back to discuss the form. Form viewable from [employer] email.

## 2017-06-06 NOTE — Telephone Encounter (Signed)
Pt is calling regarding a form she sent through my chart for [facility]. Please call to update the status.

## 2017-06-07 NOTE — Telephone Encounter (Signed)
Faxed signed approval to participate in exercise program at [facility] to Ave FilterStephen Bailey, PT, PhD, [phone]

## 2017-06-07 NOTE — Telephone Encounter (Signed)
Awaiting Dr. Odessa FlemingKlein's signature.

## 2017-06-09 ENCOUNTER — Ambulatory Visit: Payer: Self-pay | Admitting: Adult Health

## 2017-06-09 ENCOUNTER — Encounter: Payer: Self-pay | Admitting: Adult Health

## 2017-06-09 VITALS — BP 90/60 | HR 80 | Temp 98.0°F | Resp 16 | Ht 59.0 in | Wt 102.0 lb

## 2017-06-09 DIAGNOSIS — R945 Abnormal results of liver function studies: Secondary | ICD-10-CM

## 2017-06-09 DIAGNOSIS — R7989 Other specified abnormal findings of blood chemistry: Secondary | ICD-10-CM

## 2017-06-09 DIAGNOSIS — F411 Generalized anxiety disorder: Secondary | ICD-10-CM

## 2017-06-09 DIAGNOSIS — Z0000 Encounter for general adult medical examination without abnormal findings: Secondary | ICD-10-CM

## 2017-06-09 MED ORDER — SERTRALINE HCL 25 MG PO TABS: 25.0000 mg | ORAL_TABLET | Freq: Every day | ORAL | 1 refills | Status: DC

## 2017-06-09 NOTE — Progress Notes (Signed)
Subjective:    Patient ID: Catherine Mendez, female   DOB: 28-Jun-1986, 31 y.o.   MRN: [id number]  HPI   Patient is a pleasant  31 year old caucasian  female in no acute distress who was seen last for a sinus infection on 05/26/17 and is here today for follow up after starting Zoloft 25 mg daily  on that day as well for generalized anxiety/ depression. She is taking 12.5mg  daily instead of 25 mg and reports " feeling much better".   She was also given [facility] counselors line and advised to call.   LMP - 8 days ago. 06/01/17 regular( was 5 days early)   She has seen all specialists she was referred to except for OBGYN and she reports she will be scheduling this as well.   Sinusitis has improved per patient - she reports sinus pressure has resolved. She continues to have mild post nasal drip.   Patient  denies any fever, chills, rash, chest pain, shortness of breath, nausea, vomiting, or diarrhea.   Zoloft response has improved her mood she reports.. She reports she had been taking it during the later and took 25 mg for one week but felt overstimulated and  Was not sleeping well but  She reports she has halfed the scored tablets and is now taking 12.5 mg daily. She reports she is feeling so much better and has rested better.  She denies any homicidal or suicidal ideations. She is smiling and is not tearful at this visit as he was at last visit. She continues to have the stress of caring for her daughter alone most of the time as her husband works most days that are really long around the holidays but she does report that she is coping with this better and her family made a surprise visit for thanksgiving that was not planned and she was happy and smiling about this. She reports they had a great time and she enjoyed.  Catherine Mendez her cardiologist who follows her POTS approved her participation in [facility] exercise program/   She has neurology follow up 06/14/17  She has cardiology follow up  07/14/16.  Blood pressure 90/60, pulse 80, temperature 98 F (36.7 C), resp. rate 16, height 4\' 11"  (1.499 m), weight 102 lb (46.3 kg), last menstrual period 06/01/2017, SpO2 98 %. She denies any other concerns.  Review of Systems  Constitutional: Negative.   HENT: Positive for postnasal drip. Negative for congestion, ear discharge, ear pain, hearing loss, mouth sores, nosebleeds, sinus pressure, sinus pain, sneezing, sore throat and trouble swallowing.   Respiratory: Negative.   Cardiovascular: Negative.   Skin: Negative.   Neurological:       POTS followed by Catherine Mendez and reports doing well no further syncope.   Psychiatric/Behavioral: Negative.      Objective:    Physical Exam  Constitutional: She is oriented to person, place, and time and well-developed, well-nourished, and in no distress. No distress. She is not intubated.  HENT:  Head: Normocephalic and atraumatic.  Right Ear: Hearing, tympanic membrane, external ear and ear canal normal.  Left Ear: Hearing, external ear and ear canal normal. A middle ear effusion (very minimal) is present.  Nose: Nose normal. Right sinus exhibits no maxillary sinus tenderness and no frontal sinus tenderness. Left sinus exhibits no maxillary sinus tenderness and no frontal sinus tenderness.  Mouth/Throat: Uvula is midline, oropharynx is clear and moist and mucous membranes are normal.  Eyes: Conjunctivae, EOM and lids are normal.  Pupils are equal, round, and reactive to light.  Neck: Trachea normal, normal range of motion and full passive range of motion without pain. Neck supple. No tracheal tenderness present. No tracheal deviation present.  Cardiovascular: Normal rate, S1 normal, S2 normal, normal heart sounds and intact distal pulses. Exam reveals no distant heart sounds and no friction rub.  Pulmonary/Chest: Effort normal and breath sounds normal. No accessory muscle usage or stridor. No apnea, no tachypnea and no bradypnea. She is not  intubated. No respiratory distress.  Abdominal: Soft. Normal appearance, normal aorta and bowel sounds are normal. She exhibits no distension. There is no tenderness.  Musculoskeletal: Normal range of motion.  Lymphadenopathy:    She has no cervical adenopathy.  Neurological: She is alert and oriented to person, place, and time. Gait normal. GCS score is 15.  Skin: Skin is warm, dry and intact. No rash noted. She is not diaphoretic. No cyanosis or erythema. No pallor. Nails show no clubbing.  Psychiatric: Mood, memory, affect and judgment normal.  Vitals reviewed.   Assessment:   Routine adult health maintenance - Plan: VITAMIN D 25 Hydroxy (Vit-D Deficiency, Fractures), CBC w/Diff, Comprehensive metabolic panel  Generalized anxiety disorder    Plan:   Meds ordered this encounter  Medications  . sertraline (ZOLOFT) 25 MG tablet    Sig: Take 1 tablet (25 mg total) by mouth daily. May continue take 1/2 of scored tablet daily if 25 mg to strong.    Dispense:  30 tablet    Refill:  1   I have already ordered this as a future order in the computer. Will check Full Panel  In December for 6 month recheck and will check Vitamin D again in three months around February. DO NOT DO BOTH AT NEXT LAB DRAW/ EXECUTIVE PANEL ONLY.  Orders Placed This Encounter  Procedures  . VITAMIN D 25 Hydroxy (Vit-D Deficiency, Fractures)    This is due three months from 06/09/17 not to be done with labs in December.    Standing Status:   Future    Standing Expiration Date:   10/07/2017  . CMP12+LP+TP+TSH+6AC+CBC/D/Plt    Must be fasting please check with patient    Standing Status:   Future    Standing Expiration Date:   08/09/2017    Order Specific Question:   Has the patient fasted?    Answer:   Yes    Return in four weeks for follow up and sooner if needed. Black box warning for medication has been discussed and patient verbalized understanding and will seek immediately medical care if the office is  closed at any time by calling 911 or being taken to nearest emergency room.  [facility].

## 2017-06-15 ENCOUNTER — Ambulatory Visit: Payer: Self-pay | Admitting: Adult Health

## 2017-06-15 ENCOUNTER — Encounter: Payer: Self-pay | Admitting: Adult Health

## 2017-06-15 VITALS — BP 90/60 | HR 81 | Temp 98.0°F | Resp 16 | Ht 59.0 in | Wt 104.0 lb

## 2017-06-15 DIAGNOSIS — J0101 Acute recurrent maxillary sinusitis: Secondary | ICD-10-CM

## 2017-06-15 MED ORDER — NEOMYCIN-POLYMYXIN-HC 3.5-10000-1 OT SOLN: 3.0000 [drp] | Freq: Four times a day (QID) | OTIC | 0 refills | Status: DC

## 2017-06-15 MED ORDER — CLINDAMYCIN HCL 150 MG PO CAPS: 150.0000 mg | ORAL_CAPSULE | Freq: Three times a day (TID) | ORAL | 0 refills | Status: DC

## 2017-06-15 NOTE — Patient Instructions (Signed)
Clindamycin capsules What is this medicine? CLINDAMYCIN (Hobson sin) is a lincosamide antibiotic. It is used to treat certain kinds of bacterial infections. It will not work for colds, flu, or other viral infections. This medicine may be used for other purposes; ask your health care provider or pharmacist if you have questions. COMMON BRAND NAME(S): Cleocin What should I tell my health care provider before I take this medicine? They need to know if you have any of these conditions: -kidney disease -liver disease -stomach problems like colitis -an unusual or allergic reaction to clindamycin, lincomycin, or other medicines, foods, dyes like tartrazine or preservatives -pregnant or trying to get pregnant -breast-feeding How should I use this medicine? Take this medicine by mouth with a full glass of water. Follow the directions on the prescription label. You can take this medicine with food or on an empty stomach. If the medicine upsets your stomach, take it with food. Take your medicine at regular intervals. Do not take your medicine more often than directed. Take all of your medicine as directed even if you think your are better. Do not skip doses or stop your medicine early. Talk to your pediatrician regarding the use of this medicine in children. Special care may be needed. Overdosage: If you think you have taken too much of this medicine contact a poison control center or emergency room at once. NOTE: This medicine is only for you. Do not share this medicine with others. What if I miss a dose? If you miss a dose, take it as soon as you can. If it is almost time for your next dose, take only that dose. Do not take double or extra doses. What may interact with this medicine? -birth control pills -erythromycin -medicines that relax muscles for surgery -rifampin This list may not describe all possible interactions. Give your health care provider a list of all the medicines, herbs,  non-prescription drugs, or dietary supplements you use. Also tell them if you smoke, drink alcohol, or use illegal drugs. Some items may interact with your medicine. What should I watch for while using this medicine? Tell your doctor or healthcare professional if your symptoms do not start to get better or if they get worse. Do not treat diarrhea with over the counter products. Contact your doctor if you have diarrhea that lasts more than 2 days or if it is severe and watery. What side effects may I notice from receiving this medicine? Side effects that you should report to your doctor or health care professional as soon as possible: -allergic reactions like skin rash, itching or hives, swelling of the face, lips, or tongue -dark urine -pain on swallowing -redness, blistering, peeling or loosening of the skin, including inside the mouth -unusual bleeding or bruising -unusually weak or tired -yellowing of eyes or skin Side effects that usually do not require medical attention (report to your doctor or health care professional if they continue or are bothersome): -diarrhea -itching in the rectal or genital area -joint pain -nausea, vomiting -stomach pain This list may not describe all possible side effects. Call your doctor for medical advice about side effects. You may report side effects to FDA at 1-800-FDA-1088. Where should I keep my medicine? Keep out of the reach of children. Store at room temperature between 20 and 25 degrees C (68 and 77 degrees F). Throw away any unused medicine after the expiration date. NOTE: This sheet is a summary. It may not cover all possible information. If you  have questions about this medicine, talk to your doctor, pharmacist, or health care provider.  2018 Elsevier/Gold Standard (2015-10-01 16:34:00) Sinusitis, Adult Sinusitis is soreness and inflammation of your sinuses. Sinuses are hollow spaces in the bones around your face. They are located:  Around  your eyes.  In the middle of your forehead.  Behind your nose.  In your cheekbones.  Your sinuses and nasal passages are lined with a stringy fluid (mucus). Mucus normally drains out of your sinuses. When your nasal tissues get inflamed or swollen, the mucus can get trapped or blocked so air cannot flow through your sinuses. This lets bacteria, viruses, and funguses grow, and that leads to infection. Follow these instructions at home: Medicines  Take, use, or apply over-the-counter and prescription medicines only as told by your doctor. These may include nasal sprays.  If you were prescribed an antibiotic medicine, take it as told by your doctor. Do not stop taking the antibiotic even if you start to feel better. Hydrate and Humidify  Drink enough water to keep your pee (urine) clear or pale yellow.  Use a cool mist humidifier to keep the humidity level in your home above 50%.  Breathe in steam for 10-15 minutes, 3-4 times a day or as told by your doctor. You can do this in the bathroom while a hot shower is running.  Try not to spend time in cool or dry air. Rest  Rest as much as possible.  Sleep with your head raised (elevated).  Make sure to get enough sleep each night. General instructions  Put a warm, moist washcloth on your face 3-4 times a day or as told by your doctor. This will help with discomfort.  Wash your hands often with soap and water. If there is no soap and water, use hand sanitizer.  Do not smoke. Avoid being around people who are smoking (secondhand smoke).  Keep all follow-up visits as told by your doctor. This is important. Contact a doctor if:  You have a fever.  Your symptoms get worse.  Your symptoms do not get better within 10 days. Get help right away if:  You have a very bad headache.  You cannot stop throwing up (vomiting).  You have pain or swelling around your face or eyes.  You have trouble seeing.  You feel confused.  Your  neck is stiff.  You have trouble breathing. This information is not intended to replace advice given to you by your health care provider. Make sure you discuss any questions you have with your health care provider. Document Released: 12/15/2007 Document Revised: 02/22/2016 Document Reviewed: 04/23/2015 Elsevier Interactive Patient Education  [employer]2018 Elsevier Inc.

## 2017-06-15 NOTE — Progress Notes (Signed)
Subjective:     Patient ID: Catherine Mendez, female   DOB: 1985-11-11, 31 y.o.   MRN: [id number]  HPI Patient is a 31 year old female in no acute distress who presents to the clinic with complains of right ear pain and facial pressure. She reports usually Dymista clears her nasal drainage out well. She reports sinus symptoms have returned full force x the past 2 days. She had continued to be have nasal drainage.   Patient  denies any fever, chills, rash, chest pain, shortness of breath, nausea, vomiting, or diarrhea.  She was treated 05/26/17 for sinusitis and returned for follow up on 06/09/17 for follow up on anxiety and only had mild symptoms at the time in regards to sinusitis and was much improved from initial 05/26/17 visit.    She reports she has had a history of sinus infections in  the fall. She saw  ENT last year( unsure of name)  and had nasal  swab cultured she is unsure what this bacteria  was but reports antibiotics cleared it up after she had been on antibiotics for three months on and off from other providers. She denies MRSA.   Patient  denies any fever, chills, rash, chest pain, shortness of breath, nausea, vomiting, or diarrhea.  Patient's last menstrual period was 06/01/2017 (exact date).   Review of Systems  Constitutional: Negative.   HENT: Positive for ear pain, postnasal drip and sinus pressure. Negative for sore throat and trouble swallowing.   Eyes: Negative.   Respiratory: Negative.   Cardiovascular: Negative.   Gastrointestinal: Negative.   Genitourinary: Negative.   Musculoskeletal: Negative.   Skin: Negative.   Neurological: Negative.        POTS history no change   Hematological: Negative.   Psychiatric/Behavioral: Negative.        Objective:   Physical Exam  Constitutional: She is oriented to person, place, and time. Vital signs are normal. She appears well-developed and well-nourished. She is active. No distress.  HENT:  Head: Normocephalic and  atraumatic.  Right Ear: There is swelling (ear canal mild and pink in color ). Tympanic membrane is erythematous. Tympanic membrane is not perforated. A middle ear effusion is present.  Left Ear: Tympanic membrane is not perforated and not erythematous. A middle ear effusion is present.  Nose: Mucosal edema and rhinorrhea present. Right sinus exhibits maxillary sinus tenderness. Right sinus exhibits no frontal sinus tenderness. Left sinus exhibits maxillary sinus tenderness. Left sinus exhibits no frontal sinus tenderness.  Mouth/Throat: Uvula is midline and mucous membranes are normal. No uvula swelling. Posterior oropharyngeal erythema present. No oropharyngeal exudate, posterior oropharyngeal edema or tonsillar abscesses.  Eyes: Conjunctivae and EOM are normal. Pupils are equal, round, and reactive to light.  Neck: Normal range of motion. Neck supple. No JVD present. No tracheal deviation present.  Cardiovascular: Normal rate, regular rhythm, normal heart sounds and intact distal pulses. Exam reveals no gallop and no friction rub.  No murmur heard. Pulmonary/Chest: Effort normal and breath sounds normal. No stridor. No respiratory distress. She has no wheezes. She has no rales. She exhibits no tenderness.  Abdominal: Soft.  Musculoskeletal: Normal range of motion.  Lymphadenopathy:       Head (right side): No submental, no submandibular, no tonsillar, no preauricular, no posterior auricular and no occipital adenopathy present.       Head (left side): No submental, no submandibular, no tonsillar, no preauricular, no posterior auricular and no occipital adenopathy present.    She has  no cervical adenopathy.  Neurological: She is alert and oriented to person, place, and time. She has normal reflexes.  Skin: Skin is warm and dry. No rash noted. She is not diaphoretic. No erythema. No pallor.  Psychiatric: She has a normal mood and affect. Her behavior is normal. Judgment and thought content normal.   Nursing note and vitals reviewed.      Assessment:     Acute recurrent maxillary sinusitis      Plan:     Meds ordered this encounter  Medications  . clindamycin (CLEOCIN) 150 MG capsule    Sig: Take 1 capsule (150 mg total) by mouth 3 (three) times daily.    Dispense:  14 capsule    Refill:  0  . neomycin-polymyxin-hydrocortisone (CORTISPORIN) OTIC solution    Sig: Place 3 drops into the right ear 4 (four) times daily.    Dispense:  10 mL    Refill:  0    Please tell her I decided to prescribe ear droips for her right ear as well uses as directed and report any unwanted or unusual side effects   She will continue allergy  nasal spray and use nasal saline rinse per package instructions.She will return to office within 72 hours if no improvement and will seek urgent care or emergency room after hours. She will be referred by to ENT if no improvement with this treatment for evaluation if symptoms worsen or do not improve, return or persist.  Advised to return to clinic for an appointment if no improvement within 72 hours or if any symptoms change or worsen. Advised ER or urgent Care if after hours or on weekend. 911 for emergency symptoms at any time.   Patient verbalized understanding of all instructions given and denies any further questions at this time.

## 2017-06-22 ENCOUNTER — Ambulatory Visit: Payer: Self-pay | Admitting: Adult Health

## 2017-06-22 ENCOUNTER — Telehealth: Payer: Self-pay

## 2017-06-22 ENCOUNTER — Encounter: Payer: Self-pay | Admitting: Adult Health

## 2017-06-22 VITALS — BP 98/62 | HR 97 | Temp 99.8°F | Resp 16 | Ht 60.0 in | Wt 104.0 lb

## 2017-06-22 DIAGNOSIS — Z0000 Encounter for general adult medical examination without abnormal findings: Secondary | ICD-10-CM

## 2017-06-22 DIAGNOSIS — J0101 Acute recurrent maxillary sinusitis: Secondary | ICD-10-CM

## 2017-06-22 MED ORDER — AMOXICILLIN-POT CLAVULANATE 875-125 MG PO TABS: 1.0000 | ORAL_TABLET | Freq: Two times a day (BID) | ORAL | 0 refills | Status: DC

## 2017-06-22 NOTE — Progress Notes (Signed)
Subjective:     Patient ID: Catherine Mendez, female   DOB: 1986-02-17, 31 y.o.   MRN: [id number]  HPI   Patient is a 31 year old female in no acute distress  who returns to the clinic for follow up today 06/22/17   sinusitis and she reports she finished Clindamycin yesterday without much improvement since previous visit. She is continuing to have maxillary sinus pressure , post nasal drip and nasal congestion with mild ear pain bilaterally. Mild nose bleeds since last office visit - gains hemostasis easily she reports.   Patient  denies any fever, chills, rash, chest pain, shortness of breath, nausea, vomiting, or diarrhea.   Treatment history :   Augmentin for ten days in 05/26/17 - improved /   Clindamycin on 06/15/2017 for recurrent sinusitis    She is currently doing PT exercise program approved by her cardiologist Dr.Klein   Zoloft doing well for patient she reports. She is sleeping well now that she is taking medication in the morning.      Current Outpatient Medications:  .  Azelastine-Fluticasone (DYMISTA NA), Place into the nose., Disp: , Rfl:  .  cholecalciferol (VITAMIN D) 1000 units tablet, Take 2,000 Units by mouth daily., Disp: , Rfl:  .  midodrine (PROAMATINE) 2.5 MG tablet, Take 1-2 tablets (2.5 mg- 5 mg) three times a day at 7 am, 11 am, & 3 pm as directed, Disp: 540 tablet, Rfl: 3 .  neomycin-polymyxin-hydrocortisone (CORTISPORIN) OTIC solution, Place 3 drops into the right ear 4 (four) times daily., Disp: 10 mL, Rfl: 0 .  pantoprazole (PROTONIX) 40 MG tablet, Take by mouth., Disp: , Rfl:  .  sertraline (ZOLOFT) 25 MG tablet, Take 1 tablet (25 mg total) by mouth daily. May continue take 1/2 of scored tablet daily if 25 mg to strong., Disp: 30 tablet, Rfl: 1 .  SUMAtriptan (IMITREX) 100 MG tablet, Take by mouth., Disp: , Rfl:  .  propranolol (INDERAL) 20 MG tablet, Take one tablet (20 mg) by mouth twice daily as needed for fast heart rates/ palpitations (Patient not  taking: Reported on 06/22/2017), Disp: 60 tablet, Rfl: 6  Blood pressure 98/62, pulse 97, temperature 99.8 F (37.7 C), resp. rate 16, height 5' (1.524 m), weight 104 lb (47.2 kg), last menstrual period 06/01/2017, SpO2 98 %.   Review of Systems  Constitutional: Negative for activity change, appetite change, chills, diaphoresis, fatigue, fever and unexpected weight change.  HENT: Positive for ear pain, postnasal drip, rhinorrhea, sinus pressure and sneezing. Negative for sore throat, tinnitus, trouble swallowing and voice change.   Respiratory: Negative.   Cardiovascular: Negative.   Gastrointestinal: Negative.   Genitourinary: Negative.   Musculoskeletal: Negative.   Skin: Negative.   Neurological: Negative.   Hematological: Negative.   Psychiatric/Behavioral: Negative.        Objective:   Physical Exam  Constitutional: She is oriented to person, place, and time. She appears well-developed and well-nourished. No distress.  HENT:  Head: Normocephalic and atraumatic.  Right Ear: Hearing, tympanic membrane, external ear and ear canal normal. No swelling or tenderness. Tympanic membrane is not perforated and not erythematous. No decreased hearing is noted.  Left Ear: Hearing, tympanic membrane, external ear and ear canal normal. No swelling or tenderness. Tympanic membrane is not perforated and not erythematous. No decreased hearing is noted.  Nose: Mucosal edema and rhinorrhea present. Right sinus exhibits maxillary sinus tenderness (mild with palpation bilaterally ). Left sinus exhibits maxillary sinus tenderness.  Mouth/Throat: Uvula is midline  and mucous membranes are normal. No uvula swelling. Posterior oropharyngeal erythema present. No oropharyngeal exudate, posterior oropharyngeal edema or tonsillar abscesses.  Neck: Normal range of motion. Neck supple.  Cardiovascular: Normal rate, regular rhythm, normal heart sounds and intact distal pulses. Exam reveals no gallop and no  friction rub.  No murmur heard. Pulmonary/Chest: Effort normal and breath sounds normal. No respiratory distress. She has no wheezes. She has no rales. She exhibits no tenderness.  Abdominal: Soft. Bowel sounds are normal. She exhibits no distension.  Musculoskeletal: Normal range of motion. She exhibits no edema, tenderness or deformity.  Lymphadenopathy:    She has no cervical adenopathy.  Neurological: She is alert and oriented to person, place, and time.  Patient moves on and off of exam table and in room without difficulty. Gait is normal in hall and in room. Patient is oriented to person place time and situation. Patient answers questions appropriately and engages in conversation.  Skin: Skin is warm and dry. No rash noted. She is not diaphoretic. No erythema. No pallor.  Psychiatric: She has a normal mood and affect. Her behavior is normal. Judgment and thought content normal.  Nursing note and vitals reviewed.      Assessment:     Acute recurrent maxillary sinusitis      Plan:     Meds ordered this encounter  Medications  . amoxicillin-clavulanate (AUGMENTIN) 875-125 MG tablet    Sig: Take 1 tablet by mouth 2 (two) times daily.    Dispense:  20 tablet    Refill:  0   Advised to use sterile saline only not tap water for nasal saline rinse.     Dr. Katherine MantleBennet Scott is her ENT/ Armando Gangracy Greene RN attempted to call to schedule urgent appointment though office would not schedule and requests patient call.   Provider thoroughly discussed in collaboration above plan with supervising physician Dr. Julieanne Mansonichard Gilbert who is in agreement with the care plan as above to restart Augmentin and sent to ENT for follow up.   Return to the clinic on 06/24/17 for recheck.   Advised to return to clinic for an appointment if no improvement within 72 hours or if any symptoms change or worsen. Advised ER or urgent Care if after hours or on weekend. 911 for emergency symptoms at any time.    Patient  verbalized understanding of all instructions given and denies any further questions at this time.

## 2017-06-22 NOTE — Patient Instructions (Signed)
Sinus Rinse What is a sinus rinse? A sinus rinse is a simple home treatment that is used to rinse your sinuses with a sterile mixture of salt and water (saline solution). Sinuses are air-filled spaces in your skull behind the bones of your face and forehead that open into your nasal cavity. You will use the following:  Saline solution.  Neti pot or spray bottle. This releases the saline solution into your nose and through your sinuses. Neti pots and spray bottles can be purchased at your local pharmacy, a health food store, or online.  When would I do a sinus rinse? A sinus rinse can help to clear mucus, dirt, dust, or pollen from the nasal cavity. You may do a sinus rinse when you have a cold, a virus, nasal allergy symptoms, a sinus infection, or stuffiness in the nose or sinuses. If you are considering a sinus rinse:  Ask your child's health care provider before performing a sinus rinse on your child.  Do not do a sinus rinse if you have had ear or nasal surgery, ear infection, or blocked ears.  How do I do a sinus rinse?  Wash your hands.  Disinfect your device according to the directions provided and then dry it.  Use the solution that comes with your device or one that is sold separately in stores. Follow the mixing directions on the package.  Fill your device with the amount of saline solution as directed by the device instructions.  Stand over a sink and tilt your head sideways over the sink.  Place the spout of the device in your upper nostril (the one closer to the ceiling).  Gently pour or squeeze the saline solution into the nasal cavity. The liquid should drain to the lower nostril if you are not overly congested.  Gently blow your nose. Blowing too hard may cause ear pain.  Repeat in the other nostril.  Clean and rinse your device with clean water and then air-dry it. Are there risks of a sinus rinse? Sinus rinse is generally very safe and effective. However,  there are a few risks, which include:  A burning sensation in the sinuses. This may happen if you do not make the saline solution as directed. Make sure to follow all directions when making the saline solution.  Infection from contaminated water. This is rare, but possible.  Nasal irritation.  This information is not intended to replace advice given to you by your health care provider. Make sure you discuss any questions you have with your health care provider. Document Released: 01/23/2014 Document Revised: 05/25/2016 Document Reviewed: 11/13/2013 Elsevier Interactive Patient Education  2017 Else[employer] Inc. Sinusitis, Adult Sinusitis is soreness and inflammation of your sinuses. Sinuses are hollow spaces in the bones around your face. They are located:  Around your eyes.  In the middle of your forehead.  Behind your nose.  In your cheekbones.  Your sinuses and nasal passages are lined with a stringy fluid (mucus). Mucus normally drains out of your sinuses. When your nasal tissues get inflamed or swollen, the mucus can get trapped or blocked so air cannot flow through your sinuses. This lets bacteria, viruses, and funguses grow, and that leads to infection. Follow these instructions at home: Medicines  Take, use, or apply over-the-counter and prescription medicines only as told by your doctor. These may include nasal sprays.  If you were prescribed an antibiotic medicine, take it as told by your doctor. Do not stop taking the antibiotic even   if you start to feel better. Hydrate and Humidify  Drink enough water to keep your pee (urine) clear or pale yellow.  Use a cool mist humidifier to keep the humidity level in your home above 50%.  Breathe in steam for 10-15 minutes, 3-4 times a day or as told by your doctor. You can do this in the bathroom while a hot shower is running.  Try not to spend time in cool or dry air. Rest  Rest as much as possible.  Sleep with your head raised  (elevated).  Make sure to get enough sleep each night. General instructions  Put a warm, moist washcloth on your face 3-4 times a day or as told by your doctor. This will help with discomfort.  Wash your hands often with soap and water. If there is no soap and water, use hand sanitizer.  Do not smoke. Avoid being around people who are smoking (secondhand smoke).  Keep all follow-up visits as told by your doctor. This is important. Contact a doctor if:  You have a fever.  Your symptoms get worse.  Your symptoms do not get better within 10 days. Get help right away if:  You have a very bad headache.  You cannot stop throwing up (vomiting).  You have pain or swelling around your face or eyes.  You have trouble seeing.  You feel confused.  Your neck is stiff.  You have trouble breathing. This information is not intended to replace advice given to you by your health care provider. Make sure you discuss any questions you have with your health care provider. Document Released: 12/15/2007 Document Revised: 02/22/2016 Document Reviewed: 04/23/2015 Elsevier Interactive Patient Education  2018 Else[employer] Inc.  

## 2017-06-22 NOTE — Telephone Encounter (Signed)
Left message with patient to contact Dr. Marion DownerScott Bennett with [facility] to schedule an appointment for her sinusitis. She is already an established patient with [facility]. Instructions given to call us back as soon as possible.

## 2017-06-23 ENCOUNTER — Telehealth: Payer: Self-pay | Admitting: Adult Health

## 2017-06-23 MED ORDER — LEVOFLOXACIN 500 MG PO TABS: 500.0000 mg | ORAL_TABLET | Freq: Every day | ORAL | 0 refills | Status: DC

## 2017-06-23 NOTE — Telephone Encounter (Signed)
06/23/17 Patient called to inform provider ENT called and scheduled an appointment for her on  06/27/17 Monday and she reports she was able to find out the bacteria she had last year was Hemophilus Influenza by nasal swab in 2017 and was treated with Levaquin for a total of 21 days last year.   Since patient has been on Augmentin recently and treated as well with Clindamycin and given history will discontinue Augmentin given history and place on a more broad spectrum antibiotic for coverage.   She has had Augmentin today and will start Levaquin on 06/24/17.  Meds ordered this encounter  Medications  . levofloxacin (LEVAQUIN) 500 MG tablet    Sig: Take 1 tablet (500 mg total) by mouth daily. Stop Augmentin and start Levaquin on 06/24/17    Dispense:  7 tablet    Refill:  0    Discussed with patient continued need to keep appointment and follow up with ENT for further evaluation and work up on Monday and seeking medical care over the weekend as below if symptoms change or worsen.  Provider thoroughly discussed in collaboration above plan with supervising physician Dr. Julieanne Mansonichard Gilbert who is in agreement with the care plan as above.    Advised to return to clinic for an appointment if no improvement within 72 hours or if any symptoms change or worsen. Advised ER or urgent Care if after hours or on weekend. 911 for emergency symptoms at any time.   Patient verbalized understanding of all instructions given and denies any further questions at this time.

## 2017-06-24 ENCOUNTER — Ambulatory Visit: Payer: Self-pay | Admitting: Adult Health

## 2017-06-26 DIAGNOSIS — G43109 Migraine with aura, not intractable, without status migrainosus: Secondary | ICD-10-CM | POA: Insufficient documentation

## 2017-06-26 DIAGNOSIS — G43909 Migraine, unspecified, not intractable, without status migrainosus: Secondary | ICD-10-CM | POA: Insufficient documentation

## 2017-06-29 ENCOUNTER — Telehealth: Payer: Self-pay

## 2017-06-29 NOTE — Addendum Note (Signed)
Addended by: Berniece PapFLINCHUM, Kaneshia Cater S on: 06/29/2017 11:59 AM   Modules accepted: Orders

## 2017-06-29 NOTE — Telephone Encounter (Signed)
Needed to know name of OB Gyn that she was referred to; Referral made to Dr. Marcelle OverlieMichelle Grewal at [phone] back in September but pt states she got a new phone and doesn't have contact information for the provider; pt appreciative of phone call

## 2017-06-29 NOTE — Progress Notes (Signed)
Patient ID: Catherine Mendez, female   DOB: 01-22-1986, 31 y.o.   MRN: [id number]  Orders Placed This Encounter  Procedures  .       Marland Kitchen. Ambulatory referral to Obstetrics / Gynecology    Referral Priority:   Routine    Referral Type:   Consultation    Referred to Provider:   Marcelle OverlieGrewal, Ericson Nafziger, MD    Requested Specialty:   Obstetrics and Gynecology    Number of Visits Requested:   1   ENT appointment has been done.   Office above requests another referral be placed as they are unable to locate previous. Patient had called and spoke with Joen LauraKathy Harrison RN office manager to inquire on the referral as she has a new phone and lost the message where the above OBGYN office called her in September when originally refered. She has been to multiple specialties and is now ready to see OBGYN so new order has been placed.   Office will notify patient.

## 2017-07-14 ENCOUNTER — Ambulatory Visit: Payer: BLUE CROSS/BLUE SHIELD | Admitting: Internal Medicine

## 2017-07-25 ENCOUNTER — Telehealth: Payer: Self-pay

## 2017-07-25 NOTE — Telephone Encounter (Signed)
CAlled pt to f/u regarding referral to OB/GYN; original referral made in September 2018; called Dr. Lynnell DikeGrewal's office and they did not have record of receiving referral; sent another order for referral on 06/29/17 as requested by office; called pt today and she still has not heard from office regarding appt; faxed another referral to office and requested pt contact us in a week if she has not heard back about scheduling appt; pt verb u/o

## 2017-07-27 ENCOUNTER — Telehealth: Payer: Self-pay

## 2017-07-27 NOTE — Telephone Encounter (Signed)
Called pt to inform her that I spoke with Earna CoderKaren Talbert, referral coordinator, at Dr. Lynnell DikeGrewal's office and she has an appt on 08/18/17 at 2:40 with Dr. Vincente PoliGrewal; gave pt office number to call in case she needed to reschedule; pt appreciative of the call and referral

## 2017-08-17 ENCOUNTER — Other Ambulatory Visit: Payer: Self-pay

## 2017-08-17 DIAGNOSIS — Z0000 Encounter for general adult medical examination without abnormal findings: Secondary | ICD-10-CM

## 2017-08-18 ENCOUNTER — Telehealth: Payer: Self-pay | Admitting: Adult Health

## 2017-08-18 ENCOUNTER — Encounter: Payer: Self-pay | Admitting: Adult Health

## 2017-08-18 DIAGNOSIS — Z0000 Encounter for general adult medical examination without abnormal findings: Secondary | ICD-10-CM

## 2017-08-18 LAB — VITAMIN D 25 HYDROXY (VIT D DEFICIENCY, FRACTURES): VIT D 25 HYDROXY: 20.7 ng/mL — AB (ref 30.0–100.0)

## 2017-08-18 MED ORDER — VITAMIN D (ERGOCALCIFEROL) 1.25 MG (50000 UNIT) PO CAPS: 50000.0000 [IU] | ORAL_CAPSULE | ORAL | 0 refills | Status: AC

## 2017-08-18 NOTE — Progress Notes (Signed)
08/18/2017 Catherine Mendez please call patient and verify if she is taking Over the counter Vitamin D and if so how many international units  daily ?  Vitamin D level is insufficient again. She is now 20.7 down from 41.1 three months ago. I would like for her to stop the over the counter vitamin and prescribe Vitamin D 50,000 IU once weekly for 8 weeks and then start Vitamin D3 Over the counter at 2,000 to 4,000 IU daily on the ninth week.  I recommend to repeat a full panel of labs on her at around 10 weeks. I will place orders and she can call at anytime to schedule lab appointment accordingly but should be fasting with nothing to eat or drink for at least 8 hours. . Follow up as needed and for medication refills. Will you please verify that she wants this Vitamin D 3 and if so still [facility] [place] ? Thanks, Marvell FullerMichelle Clarise Chacko MSN, AGNP-C, FNP-C

## 2017-08-18 NOTE — Telephone Encounter (Signed)
I have already ordered this as a future order in the computer.  Orders Placed This Encounter  Procedures  . CMP12+LP+TP+TSH+6AC+CBC/D/Plt    Needs to be fasting    Standing Status:   Future    Standing Expiration Date:   11/15/2017    Nursing Armando Gangracy Greene sent instructions to call patient with abnormal vitamin D and labs future ordered.  Provider has not contacted patient.

## 2017-08-18 NOTE — Telephone Encounter (Signed)
Duplicate epic error.

## 2017-09-07 ENCOUNTER — Other Ambulatory Visit: Payer: Self-pay

## 2017-09-07 DIAGNOSIS — Z0000 Encounter for general adult medical examination without abnormal findings: Secondary | ICD-10-CM

## 2017-09-08 ENCOUNTER — Encounter: Payer: Self-pay | Admitting: Internal Medicine

## 2017-09-08 ENCOUNTER — Ambulatory Visit: Payer: BLUE CROSS/BLUE SHIELD | Admitting: Internal Medicine

## 2017-09-08 VITALS — BP 97/66 | HR 78 | Ht 59.0 in | Wt 104.8 lb

## 2017-09-08 DIAGNOSIS — I951 Orthostatic hypotension: Secondary | ICD-10-CM

## 2017-09-08 DIAGNOSIS — R55 Syncope and collapse: Secondary | ICD-10-CM | POA: Diagnosis not present

## 2017-09-08 DIAGNOSIS — G901 Familial dysautonomia [Riley-Day]: Secondary | ICD-10-CM

## 2017-09-08 NOTE — Progress Notes (Signed)
Patient Care Team: Flinchum, Eula FriedMichelle S, FNP as PCP - General (Family Medicine)   HPI  Catherine Mendez is a 32 y.o. female Seen in follow-up for dysautonomia She has been seen in [place] and [place]. She had a negative tilt table test.  At her last visit we initiated ProAmatine and she had taken previously.  At her last visit we prescribed low-dose beta-blockers.  She has currently stopped her ProAmatine and is using her Inderal only as needed.  This is occurred in the context of having undergone a specialized intensive physical therapy program at [employer].  She has even been able to do a little bit of running.  She is unable to do squats.  We have reviewed the physiology.  She has had some interval syncope.  This is occurred both after exercise as well as walking across the campus  She is salt repleted with NUUN and is well hydrated.  Symptoms are worse around the time of her period.  Work is rather stressful  632-1/26-year-old daughter is TEFL teachercora         Past Medical History:  Diagnosis Date  . Anemia    in past   . Anxiety    medicine in college for deopression /anxiety   . Blood transfusion without reported diagnosis    2 with childbirth/ choleithias with pregnancy  . Clotting disorder (HCC)   . Elevated liver function tests 06/10/2015  . Factor XII deficiency (HCC)   . Gestational diabetes 2016  . POTS (postural orthostatic tachycardia syndrome)     Past Surgical History:  Procedure Laterality Date  . COSMETIC SURGERY     dental implant   . MOUTH SURGERY  2013   bottom dental implant     Current Outpatient Medications  Medication Sig Dispense Refill  . Azelastine-Fluticasone (DYMISTA NA) Place into the nose.    . levofloxacin (LEVAQUIN) 500 MG tablet Take 1 tablet (500 mg total) by mouth daily. Stop Augmentin and start Levaquin on 06/24/17 7 tablet 0  . Misc Natural Products (PETADOLEX 50) 50 MG CAPS Take by mouth.    .  neomycin-polymyxin-hydrocortisone (CORTISPORIN) OTIC solution Place 3 drops into the right ear 4 (four) times daily. 10 mL 0  . pantoprazole (PROTONIX) 40 MG tablet Take by mouth.    . propranolol (INDERAL) 20 MG tablet Take one tablet (20 mg) by mouth twice daily as needed for fast heart rates/ palpitations 60 tablet 6  . sertraline (ZOLOFT) 25 MG tablet Take 1 tablet (25 mg total) by mouth daily. May continue take 1/2 of scored tablet daily if 25 mg to strong. 30 tablet 1  . SUMAtriptan (IMITREX) 100 MG tablet Take by mouth.    . Vitamin D, Ergocalciferol, (DRISDOL) 50000 units CAPS capsule Take 1 capsule (50,000 Units total) by mouth every 7 (seven) days for 8 doses. Week 9 start OTC Vitamin D3 PO 2,000 to 4,000 IU daily 8 capsule 0   No current facility-administered medications for this visit.     Allergies  Allergen Reactions  . Iodine   . Iodinated Diagnostic Agents Rash  . Shellfish Allergy Rash      Review of Systems negative except from HPI and PMH  Physical Exam BP 97/66 (BP Location: Right Arm, Patient Position: Sitting, Cuff Size: Normal)   Pulse 78   Ht 4\' 11"  (1.499 m)   Wt 104 lb 12 oz (47.5 kg)   BMI 21.16 kg/m  Well developed and nourished in no  acute distress HENT normal Neck supple with JVP-flat Clear Regular rate and rhythm, no murmurs or gallops Abd-soft with active BS No Clubbing cyanosis edema Skin-warm and dry A & Oriented  Grossly normal sensory and motor function  ECG demonstrates sinus at 78 Interval 13/07/38  Assessment and  Plan  Dysautonomia  Hypotension  Syncope  Stress   We have reviewed again the physiology size and squats.  Have encouraged horizontal leg press exercises as an alternative.  Blood pressure remains low and of encouraged her to take extra sodium prior to exercise  Asking her about parking prompted 2 years related to the issue of illness and social stigma associated with an illness which cannot be seen.  Encouraged  her via the word.    She was given a handicap parking pass  We spent more than 50% of our >25 min visit in face to face counseling regarding the above  Current medicines are reviewed at length with the patient today .  The patient does not  have concerns regarding medicines.

## 2017-09-08 NOTE — Patient Instructions (Signed)
Medication Instructions: - Your physician recommends that you continue on your current medications as directed. Please refer to the Current Medication list given to you today.  Labwork: - none ordered  Procedures/Testing: - none ordered  Follow-Up: - Your physician wants you to follow-up in: 6 months with Dr. Graciela HusbandsKlein. You will receive a reminder letter in the mail two months in advance. If you don't receive a letter, please call our office to schedule the follow-up appointment.   Any Additional Special Instructions Will Be Listed Below (If Applicable). - Disability Parking Placard application was signed for you today    If you need a refill on your cardiac medications before your next appointment, please call your pharmacy.

## 2017-09-10 LAB — CMP12+LP+TP+TSH+6AC+CBC/D/PLT
ALBUMIN: 4.3 g/dL (ref 3.5–5.5)
ALK PHOS: 62 IU/L (ref 39–117)
ALT: 30 IU/L (ref 0–32)
AST: 29 IU/L (ref 0–40)
Albumin/Globulin Ratio: 1.7 (ref 1.2–2.2)
BUN / CREAT RATIO: 17 (ref 9–23)
BUN: 10 mg/dL (ref 6–20)
Basophils Absolute: 0 10*3/uL (ref 0.0–0.2)
Basos: 0 %
Bilirubin Total: 0.6 mg/dL (ref 0.0–1.2)
CALCIUM: 8.8 mg/dL (ref 8.7–10.2)
CHOLESTEROL TOTAL: 178 mg/dL (ref 100–199)
Chloride: 103 mmol/L (ref 96–106)
Chol/HDL Ratio: 2.8 ratio (ref 0.0–4.4)
Creatinine, Ser: 0.59 mg/dL (ref 0.57–1.00)
EOS (ABSOLUTE): 0 10*3/uL (ref 0.0–0.4)
Eos: 1 %
FREE THYROXINE INDEX: 1.8 (ref 1.2–4.9)
GFR calc Af Amer: 142 mL/min/{1.73_m2} (ref >59)
GFR calc non Af Amer: 123 mL/min/{1.73_m2} (ref >59)
GGT: 32 IU/L (ref 0–60)
GLOBULIN, TOTAL: 2.5 g/dL (ref 1.5–4.5)
Glucose: 85 mg/dL (ref 65–99)
HDL: 64 mg/dL (ref >39)
HEMATOCRIT: 37.8 % (ref 34.0–46.6)
Hemoglobin: 12.6 g/dL (ref 11.1–15.9)
IMMATURE GRANS (ABS): 0 10*3/uL (ref 0.0–0.1)
IMMATURE GRANULOCYTES: 0 %
IRON: 149 ug/dL (ref 27–159)
LDH: 132 IU/L (ref 119–226)
LDL CALC: 91 mg/dL (ref 0–99)
LYMPHS ABS: 2.4 10*3/uL (ref 0.7–3.1)
LYMPHS: 36 %
MCH: 31 pg (ref 26.6–33.0)
MCHC: 33.3 g/dL (ref 31.5–35.7)
MCV: 93 fL (ref 79–97)
MONOS ABS: 0.8 10*3/uL (ref 0.1–0.9)
Monocytes: 11 %
NEUTROS PCT: 52 %
Neutrophils Absolute: 3.5 10*3/uL (ref 1.4–7.0)
Phosphorus: 3.7 mg/dL (ref 2.5–4.5)
Platelets: 223 10*3/uL (ref 150–379)
Potassium: 4 mmol/L (ref 3.5–5.2)
RBC: 4.07 x10E6/uL (ref 3.77–5.28)
RDW: 13.6 % (ref 12.3–15.4)
Sodium: 141 mmol/L (ref 134–144)
T3 UPTAKE RATIO: 26 % (ref 24–39)
T4, Total: 7.1 ug/dL (ref 4.5–12.0)
TSH: 1.99 u[IU]/mL (ref 0.450–4.500)
Total Protein: 6.8 g/dL (ref 6.0–8.5)
Triglycerides: 117 mg/dL (ref 0–149)
Uric Acid: 3.7 mg/dL (ref 2.5–7.1)
VLDL Cholesterol Cal: 23 mg/dL (ref 5–40)
WBC: 6.8 10*3/uL (ref 3.4–10.8)

## 2017-09-11 ENCOUNTER — Encounter: Payer: Self-pay | Admitting: Adult Health

## 2017-09-14 LAB — HM PAP SMEAR

## 2017-09-26 ENCOUNTER — Encounter: Payer: Self-pay | Admitting: Medical

## 2017-09-26 ENCOUNTER — Ambulatory Visit: Payer: Self-pay | Admitting: Medical

## 2017-09-26 VITALS — BP 96/60 | HR 96 | Temp 99.3°F | Resp 16 | Ht 59.5 in | Wt 104.4 lb

## 2017-09-26 DIAGNOSIS — J0110 Acute frontal sinusitis, unspecified: Secondary | ICD-10-CM

## 2017-09-26 DIAGNOSIS — J302 Other seasonal allergic rhinitis: Secondary | ICD-10-CM

## 2017-09-26 DIAGNOSIS — H6983 Other specified disorders of Eustachian tube, bilateral: Secondary | ICD-10-CM

## 2017-09-26 MED ORDER — AMOXICILLIN-POT CLAVULANATE 875-125 MG PO TABS: 1.0000 | ORAL_TABLET | Freq: Two times a day (BID) | ORAL | 0 refills | Status: DC

## 2017-09-26 NOTE — Patient Instructions (Signed)
Eustachian Tube Dysfunction The eustachian tube connects the middle ear to the back of the nose. It regulates air pressure in the middle ear by allowing air to move between the ear and nose. It also helps to drain fluid from the middle ear space. When the eustachian tube does not function properly, air pressure, fluid, or both can build up in the middle ear. Eustachian tube dysfunction can affect one or both ears. What are the causes? This condition happens when the eustachian tube becomes blocked or cannot open normally. This may result from:  Ear infections.  Colds and other upper respiratory infections.  Allergies.  Irritation, such as from cigarette smoke or acid from the stomach coming up into the esophagus (gastroesophageal reflux).  Sudden changes in air pressure, such as from descending in an airplane.  Abnormal growths in the nose or throat, such as nasal polyps, tumors, or enlarged tissue at the back of the throat (adenoids).  What increases the risk? This condition may be more likely to develop in people who smoke and people who are overweight. Eustachian tube dysfunction may also be more likely to develop in children, especially children who have:  Certain birth defects of the mouth, such as cleft palate.  Large tonsils and adenoids.  What are the signs or symptoms? Symptoms of this condition may include:  A feeling of fullness in the ear.  Ear pain.  Clicking or popping noises in the ear.  Ringing in the ear.  Hearing loss.  Loss of balance.  Symptoms may get worse when the air pressure around you changes, such as when you travel to an area of high elevation or fly on an airplane. How is this diagnosed? This condition may be diagnosed based on:  Your symptoms.  A physical exam of your ear, nose, and throat.  Tests, such as those that measure: ? The movement of your eardrum (tympanogram). ? Your hearing (audiometry).  How is this treated? Treatment  depends on the cause and severity of your condition. If your symptoms are mild, you may be able to relieve your symptoms by moving air into ("popping") your ears. If you have symptoms of fluid in your ears, treatment may include:  Decongestants.  Antihistamines.  Nasal sprays or ear drops that contain medicines that reduce swelling (steroids).  In some cases, you may need to have a procedure to drain the fluid in your eardrum (myringotomy). In this procedure, a small tube is placed in the eardrum to:  Drain the fluid.  Restore the air in the middle ear space.  Follow these instructions at home:  Take over-the-counter and prescription medicines only as told by your health care provider.  Use techniques to help pop your ears as recommended by your health care provider. These may include: ? Chewing gum. ? Yawning. ? Frequent, forceful swallowing. ? Closing your mouth, holding your nose closed, and gently blowing as if you are trying to blow air out of your nose.  Do not do any of the following until your health care provider approves: ? Travel to high altitudes. ? Fly in airplanes. ? Work in a pressurized cabin or room. ? Scuba dive.  Keep your ears dry. Dry your ears completely after showering or bathing.  Do not smoke.  Keep all follow-up visits as told by your health care provider. This is important. Contact a health care provider if:  Your symptoms do not go away after treatment.  Your symptoms come back after treatment.  You are   unable to pop your ears.  You have: ? A fever. ? Pain in your ear. ? Pain in your head or neck. ? Fluid draining from your ear.  Your hearing suddenly changes.  You become very dizzy.  You lose your balance. This information is not intended to replace advice given to you by your health care provider. Make sure you discuss any questions you have with your health care provider. Document Released: 07/25/2015 Document Revised: 12/04/2015  Document Reviewed: 07/17/2014 Elsevier Interactive Patient Education  2018 Elsevier Inc. Sinusitis, Adult Sinusitis is soreness and inflammation of your sinuses. Sinuses are hollow spaces in the bones around your face. They are located:  Around your eyes.  In the middle of your forehead.  Behind your nose.  In your cheekbones.  Your sinuses and nasal passages are lined with a stringy fluid (mucus). Mucus normally drains out of your sinuses. When your nasal tissues get inflamed or swollen, the mucus can get trapped or blocked so air cannot flow through your sinuses. This lets bacteria, viruses, and funguses grow, and that leads to infection. Follow these instructions at home: Medicines  Take, use, or apply over-the-counter and prescription medicines only as told by your doctor. These may include nasal sprays.  If you were prescribed an antibiotic medicine, take it as told by your doctor. Do not stop taking the antibiotic even if you start to feel better. Hydrate and Humidify  Drink enough water to keep your pee (urine) clear or pale yellow.  Use a cool mist humidifier to keep the humidity level in your home above 50%.  Breathe in steam for 10-15 minutes, 3-4 times a day or as told by your doctor. You can do this in the bathroom while a hot shower is running.  Try not to spend time in cool or dry air. Rest  Rest as much as possible.  Sleep with your head raised (elevated).  Make sure to get enough sleep each night. General instructions  Put a warm, moist washcloth on your face 3-4 times a day or as told by your doctor. This will help with discomfort.  Wash your hands often with soap and water. If there is no soap and water, use hand sanitizer.  Do not smoke. Avoid being around people who are smoking (secondhand smoke).  Keep all follow-up visits as told by your doctor. This is important. Contact a doctor if:  You have a fever.  Your symptoms get worse.  Your symptoms  do not get better within 10 days. Get help right away if:  You have a very bad headache.  You cannot stop throwing up (vomiting).  You have pain or swelling around your face or eyes.  You have trouble seeing.  You feel confused.  Your neck is stiff.  You have trouble breathing. This information is not intended to replace advice given to you by your health care provider. Make sure you discuss any questions you have with your health care provider. Document Released: 12/15/2007 Document Revised: 02/22/2016 Document Reviewed: 04/23/2015 Elsevier Interactive Patient Education  2018 Elsevier Inc.  

## 2017-09-26 NOTE — Progress Notes (Signed)
   Subjective:    Patient ID: Catherine Mendez, female    DOB: [birth year], 32 y.o.   MRN: [id number]  HPI 32 yo female in non acute distress. Comes in today nasal congestion.  Pressure in ears and head, no headache, symptoms stated  4 days ago .Clear discharge with yellow. Drainage down the throat, no throat pain. Initally with itchy throat. Ear pain right side yesterday, none now.No known fever. Husband sick last week with Sinusitis, and both ears infected.   Blood pressure 96/60, pulse 96, temperature 99.3 F (37.4 C), temperature source Tympanic, resp. rate 16, height 4' 11.5" (1.511 m), weight 104 lb 6.4 oz (47.4 kg), SpO2 100 %.  Review of Systems  Constitutional: Positive for fatigue. Negative for activity change, appetite change, chills and fever.  HENT: Positive for congestion, ear pain, postnasal drip, rhinorrhea, sinus pressure, sinus pain, sneezing and sore throat (itchy throat not painful). Negative for ear discharge.   Eyes: Negative for discharge and itching.  Respiratory: Positive for cough. Negative for shortness of breath and wheezing.   Cardiovascular: Negative for chest pain.  Gastrointestinal: Negative for abdominal pain.  Genitourinary: Negative for dysuria.  Musculoskeletal: Positive for back pain (from coughing). Negative for myalgias.  Skin: Negative for rash.  Allergic/Immunologic: Positive for environmental allergies and food allergies.  Neurological: Positive for headaches. Negative for dizziness, syncope and light-headedness.  Hematological: Negative for adenopathy.  Psychiatric/Behavioral: Negative for behavioral problems, self-injury and suicidal ideas. The patient is not nervous/anxious.        Objective:   Physical Exam  Constitutional: She is oriented to person, place, and time. She appears well-developed and well-nourished.  HENT:  Head: Normocephalic and atraumatic.  Right Ear: External ear normal.  Left Ear: External ear normal.  Eyes:  Conjunctivae and EOM are normal.  Neck: Normal range of motion. Neck supple.  Cardiovascular: Normal rate, regular rhythm and normal heart sounds.  Pulmonary/Chest: Effort normal and breath sounds normal.  Neurological: She is alert and oriented to person, place, and time.  Skin: Skin is warm and dry.  Psychiatric: She has a normal mood and affect. Her behavior is normal. Thought content normal.  Nursing note and vitals reviewed.   Petite female      Assessment & Plan:  Sinusitis, Eustatchian tube infection.Seasonal Allergies Meds ordered this encounter  Medications  . amoxicillin-clavulanate (AUGMENTIN) 875-125 MG tablet    Sig: Take 1 tablet by mouth 2 (two) times daily.    Dispense:  20 tablet    Refill:  0   Use Saline spray in nostrils up to  4 times a day to rinse pollen from nose , and to gargle with dilute salt water for itchy throat. Rest , fluids, OTC Tylenol as needed for fever, take as directed.Return in 3-5 days if not improving, Patient verbalizes understanding and has no questions at dishcarge.

## 2017-10-03 ENCOUNTER — Encounter: Payer: Self-pay | Admitting: Medical

## 2017-10-03 ENCOUNTER — Ambulatory Visit: Payer: Self-pay | Admitting: Medical

## 2017-10-03 VITALS — BP 106/71 | HR 90 | Temp 98.1°F | Resp 16 | Wt 101.8 lb

## 2017-10-03 DIAGNOSIS — K521 Toxic gastroenteritis and colitis: Secondary | ICD-10-CM

## 2017-10-03 NOTE — Patient Instructions (Signed)
Probiotics What are probiotics? Probiotics are the good bacteria and yeasts that live in your body and keep you and your digestive system healthy. Probiotics also help your body's defense (immune) system and protect your body against bad bacterial growth. Certain foods contain probiotics, such as yogurt. Probiotics can also be purchased as a supplement. As with any supplement or drug, it is important to discuss its use with your health care provider. What affects the balance of bacteria in my body? The balance of bacteria in your body can be affected by:  Antibiotic medicines. Antibiotics are sometimes necessary to treat infection. Unfortunately, they may kill good or friendly bacteria in your body as well as the bad bacteria. This may lead to stomach problems like diarrhea, gas, and cramping.  Disease. Some conditions are the result of an overgrowth of bad bacteria, yeasts, parasites, or fungi. These conditions include: ? Infectious diarrhea. ? Stomach and respiratory infections. ? Skin infections. ? Irritable bowel syndrome (IBS). ? Inflammatory bowel diseases. ? Ulcer due to Helicobacter pylori (H. pylori) infection. ? Tooth decay and periodontal disease. ? Vaginal infections.  Stress and poor diet may also lower the good bacteria in your body. What type of probiotic is right for me? Probiotics are available over the counter at your local pharmacy, health food, or grocery store. They come in many different forms, combinations of strains, and dosing strengths. Some may need to be refrigerated. Always read the label for storage and usage instructions. Specific strains have been shown to be more effective for certain conditions. Ask your health care provider what option is best for you. Why would I need probiotics? There are many reasons your health care provider might recommend a probiotic supplement, including:  Diarrhea.  Constipation.  IBS.  Respiratory infections.  Yeast  infections.  Acne, eczema, and other skin conditions.  Frequent urinary tract infections (UTIs).  Are there side effects of probiotics? Some people experience mild side effects when taking probiotics. Side effects are usually temporary and may include:  Gas.  Bloating.  Cramping.  Rarely, serious side effects, such as infection or immune system changes, may occur. What else do I need to know about probiotics?  There are many different strains of probiotics. Certain strains may be more effective depending on your condition. Probiotics are available in varying doses. Ask your health care provider which probiotic you should use and how often.  If you are taking probiotics along with antibiotics, it is generally recommended to wait at least 2 hours between taking the antibiotic and taking the probiotic. For more information: [facility] http://potts.com/http://nccam.nih.gov/ This information is not intended to replace advice given to you by your health care provider. Make sure you discuss any questions you have with your health care provider. Document Released: 01/23/2014 Document Revised: 05/25/2016 Document Reviewed: 09/25/2013 Elsevier Interactive Patient Education  2017 [employer]  Food Choices to Help Relieve Diarrhea, Adult When you have diarrhea, the foods you eat and your eating habits are very important. Choosing the right foods and drinks can help:  Relieve diarrhea.  Replace lost fluids and nutrients.  Prevent dehydration.  What general guidelines should I follow? Relieving diarrhea  Choose foods with less than 2 g or .07 oz. of fiber per serving.  Limit fats to less than 8 tsp (38 g or 1.34 oz.) a day.  Avoid the following: ? Foods and beverages sweetened with high-fructose corn syrup, honey, or sugar alcohols such as xylitol, sorbitol, and mannitol. ?  Foods that contain a lot of fat or sugar. ? Fried, greasy, or spicy  foods. ? High-fiber grains, breads, and cereals. ? Raw fruits and vegetables.  Eat foods that are rich in probiotics. These foods include dairy products such as yogurt and fermented milk products. They help increase healthy bacteria in the stomach and intestines (gastrointestinal tract, or GI tract).  If you have lactose intolerance, avoid dairy products. These may make your diarrhea worse.  Take medicine to help stop diarrhea (antidiarrheal medicine) only as told by your health care provider. Replacing nutrients  Eat small meals or snacks every 3-4 hours.  Eat bland foods, such as white rice, toast, or baked potato, until your diarrhea starts to get better. Gradually reintroduce nutrient-rich foods as tolerated or as told by your health care provider. This includes: ? Well-cooked protein foods. ? Peeled, seeded, and soft-cooked fruits and vegetables. ? Low-fat dairy products.  Take vitamin and mineral supplements as told by your health care provider. Preventing dehydration   Start by sipping water or a special solution to prevent dehydration (oral rehydration solution, ORS). Urine that is clear or pale yellow means that you are getting enough fluid.  Try to drink at least 8-10 cups of fluid each day to help replace lost fluids.  You may add other liquids in addition to water, such as clear juice or decaffeinated sports drinks, as tolerated or as told by your health care provider.  Avoid drinks with caffeine, such as coffee, tea, or soft drinks.  Avoid alcohol. What foods are recommended? The items listed may not be a complete list. Talk with your health care provider about what dietary choices are best for you. Grains White rice. White, [place], or pita breads (fresh or toasted), including plain rolls, buns, or bagels. White pasta. Saltine, soda, or graham crackers. Pretzels. Low-fiber cereal. Cooked cereals made with water (such as cornmeal, farina, or cream cereals). Plain  muffins. Matzo. Melba toast. Zwieback. Vegetables Potatoes (without the skin). Most well-cooked and canned vegetables without skins or seeds. Tender lettuce. Fruits Apple sauce. Fruits canned in juice. Cooked apricots, cherries, grapefruit, peaches, pears, or plums. Fresh bananas and cantaloupe. Meats and other protein foods Baked or boiled chicken. Eggs. Tofu. Fish. Seafood. Smooth nut butters. Ground or well-cooked tender beef, ham, veal, lamb, pork, or poultry. Dairy Plain yogurt, kefir, and unsweetened liquid yogurt. Lactose-free milk, buttermilk, skim milk, or soy milk. Low-fat or nonfat hard cheese. Beverages Water. Low-calorie sports drinks. Fruit juices without pulp. Strained tomato and vegetable juices. Decaffeinated teas. Sugar-free beverages not sweetened with sugar alcohols. Oral rehydration solutions, if approved by your health care provider. Seasoning and other foods Bouillon, broth, or soups made from recommended foods. What foods are not recommended? The items listed may not be a complete list. Talk with your health care provider about what dietary choices are best for you. Grains Whole grain, whole wheat, bran, or rye breads, rolls, pastas, and crackers. Wild or brown rice. Whole grain or bran cereals. Barley. Oats and oatmeal. Corn tortillas or taco shells. Granola. Popcorn. Vegetables Raw vegetables. Fried vegetables. Cabbage, broccoli, Brussels sprouts, artichokes, baked beans, beet greens, corn, kale, legumes, peas, sweet potatoes, and yams. Potato skins. Cooked spinach and cabbage. Fruits Dried fruit, including raisins and dates. Raw fruits. Stewed or dried prunes. Canned fruits with syrup. Meat and other protein foods Fried or fatty meats. Deli meats. Chunky nut butters. Nuts and seeds. Beans and lentils. Tomasa Blase. Hot dogs. Sausage. Dairy High-fat cheeses. Whole milk, chocolate milk,  and beverages made with milk, such as milk shakes. Half-and-half. Cream. sour cream. Ice  cream. Beverages Caffeinated beverages (such as coffee, tea, soda, or energy drinks). Alcoholic beverages. Fruit juices with pulp. Prune juice. Soft drinks sweetened with high-fructose corn syrup or sugar alcohols. High-calorie sports drinks. Fats and oils Butter. Cream sauces. Margarine. Salad oils. Plain salad dressings. Olives. Avocados. Mayonnaise. Sweets and desserts Sweet rolls, doughnuts, and sweet breads. Sugar-free desserts sweetened with sugar alcohols such as xylitol and sorbitol. Seasoning and other foods Honey. Hot sauce. Chili powder. Gravy. Cream-based or milk-based soups. Pancakes and waffles. Summary  When you have diarrhea, the foods you eat and your eating habits are very important.  Make sure you get at least 8-10 cups of fluid each day, or enough to keep your urine clear or pale yellow.  Eat bland foods and gradually reintroduce healthy, nutrient-rich foods as tolerated, or as told by your health care provider.  Avoid high-fiber, fried, greasy, or spicy foods. This information is not intended to replace advice given to you by your health care provider. Make sure you discuss any questions you have with your health care provider. Document Released: 09/18/2003 Document Revised: 06/25/2016 Document Reviewed: 06/25/2016 Elsevier Interactive Patient Education  2018 [employer].  Parke Simmers Diet A bland diet consists of foods that do not have a lot of fat or fiber. Foods without fat or fiber are easier for the body to digest. They are also less likely to irritate your mouth, throat, stomach, and other parts of your gastrointestinal tract. A bland diet is sometimes called a BRAT diet. What is my plan? Your health care provider or dietitian may recommend specific changes to your diet to prevent and treat your symptoms, such as:  Eating small meals often.  Cooking food until it is soft enough to chew easily.  Chewing your food well.  Drinking fluids slowly.  Not eating  foods that are very spicy, sour, or fatty.  Not eating citrus fruits, such as oranges and grapefruit.  What do I need to know about this diet?  Eat a variety of foods from the bland diet food list.  Do not follow a bland diet longer than you have to.  Ask your health care provider whether you should take vitamins. What foods can I eat? Grains  Hot cereals, such as cream of wheat. Bread, crackers, or tortillas made from refined white flour. Rice. Vegetables Canned or cooked vegetables. Mashed or boiled potatoes. Fruits Bananas. Applesauce. Other types of cooked or canned fruit with the skin and seeds removed, such as canned peaches or pears. Meats and Other Protein Sources Scrambled eggs. Creamy peanut butter or other nut butters. Lean, well-cooked meats, such as chicken or fish. Tofu. Soups or broths. Dairy Low-fat dairy products, such as milk, cottage cheese, or yogurt. Beverages Water. Herbal tea. Apple juice. Sweets and Desserts Pudding. Custard. Fruit gelatin. Ice cream. Fats and Oils Mild salad dressings. Canola or olive oil. The items listed above may not be a complete list of allowed foods or beverages. Contact your dietitian for more options. What foods are not recommended? Foods and ingredients that are often not recommended include:  Spicy foods, such as hot sauce or salsa.  Fried foods.  Sour foods, such as pickled or fermented foods.  Raw vegetables or fruits, especially citrus or berries.  Caffeinated drinks.  Alcohol.  Strongly flavored seasonings or condiments.  The items listed above may not be a complete list of foods and beverages that are not  allowed. Contact your dietitian for more information. This information is not intended to replace advice given to you by your health care provider. Make sure you discuss any questions you have with your health care provider. Document Released: 10/20/2015 Document Revised: 12/04/2015 Document Reviewed:  07/10/2014 Elsevier Interactive Patient Education  2018 [employer].

## 2017-10-03 NOTE — Progress Notes (Signed)
   Subjective:    Patient ID: Catherine Mendez, female    DOB: Sep 04, 1985, 32 y.o.   MRN: [id number]  HPI Seen on  09/26/17  Took Augmentin , started with soft stools, and diarrhea lower abdominal cramping and radiating to back. , rectum raw using Desiin and Tuck, ( has taken 8 days of medication) Reviewed with patient to stop the medication. Does use yogurt due to vomiting, cramping in lower abdomen pain and diarrhea. Yesterday had 12 times not each time was a bowel movement.. Today since midnight 4 times, little volume because she has not ate a lot. Mucus like.  Last dose of Augmenting last night. Had Augmentin prior without any problems.  In November was on 10 days of Levaquin.     Review of Systems  Constitutional: Positive for appetite change (decreased) and chills (with diarrhea, ). Negative for fever.  HENT: Negative for congestion, ear pain, nosebleeds, sinus pressure and sinus pain.   Respiratory: Negative for cough.   Cardiovascular: Negative for chest pain.  Gastrointestinal: Positive for abdominal pain (lower crampting with antibiotic) and diarrhea. Negative for nausea.  Genitourinary: Negative for dysuria.  Musculoskeletal: Positive for back pain ( abdominal cramping radiated to back).  Skin: Negative for rash.  Allergic/Immunologic: Positive for food allergies (possible lactose intolerance).  Neurological: Negative for dizziness, syncope and light-headedness.  Hematological: Negative for adenopathy.  Psychiatric/Behavioral: Negative for behavioral problems, self-injury and suicidal ideas. The patient is not nervous/anxious.        Objective:   Physical Exam  Constitutional: She is oriented to person, place, and time. She appears well-developed and well-nourished.  HENT:  Head: Normocephalic and atraumatic.  Right Ear: External ear normal.  Left Ear: External ear normal.  Eyes: Pupils are equal, round, and reactive to light.  Cardiovascular: Normal rate, regular rhythm  and normal heart sounds.  Pulmonary/Chest: Effort normal and breath sounds normal.  Abdominal: Soft. She exhibits no distension and no mass. There is no tenderness. There is no rebound and no guarding.  Musculoskeletal: Normal range of motion.  Neurological: She is alert and oriented to person, place, and time.  Skin: Skin is warm and dry.  Psychiatric: She has a normal mood and affect. Her behavior is normal. Judgment and thought content normal.  Nursing note and vitals reviewed.  Mildly hyperactive lower abdomen bowel sounds. Sore still noted on right medial side inside right nare. ( continue not to use Flonase another 7 days).       Assessment & Plan:  Diarrhea  Stop Antibiotic  Take OTC Imodium as directed.Bland diet Then titrate up Drank NUUN for  electrolyte replacement. Continue to use this electrolyte replacement. Start OTC  probiotics. Return in 3-5 days if not improving.  Patient verbalizes understanding and has no questions at discharge.

## 2017-11-04 ENCOUNTER — Telehealth: Payer: Self-pay | Admitting: Adult Health

## 2017-11-04 NOTE — Telephone Encounter (Signed)
Catherine Gangracy Greene RN called patient 11/04/17 and no answer left message. Belinda Day also sent e-mail to call office.

## 2017-11-04 NOTE — Telephone Encounter (Signed)
Spoke with patient via phone. She complains that her eyes are sticky with a greenish drainage every morning.  We instructed her to go to the [facility] at [facility] on [address] to be seen.  Pt verbalizes understanding and appreciative of call.

## 2017-11-04 NOTE — Telephone Encounter (Signed)
Attempted to contact patient but no answer.

## 2017-12-07 ENCOUNTER — Ambulatory Visit: Payer: Self-pay | Admitting: Medical

## 2017-12-12 ENCOUNTER — Encounter: Payer: Self-pay | Admitting: Internal Medicine

## 2018-01-02 ENCOUNTER — Telehealth: Payer: Self-pay | Admitting: Internal Medicine

## 2018-01-02 ENCOUNTER — Ambulatory Visit: Payer: Self-pay | Admitting: Adult Health

## 2018-01-02 ENCOUNTER — Encounter (INDEPENDENT_AMBULATORY_CARE_PROVIDER_SITE_OTHER): Payer: Self-pay

## 2018-01-02 ENCOUNTER — Encounter: Payer: Self-pay | Admitting: Adult Health

## 2018-01-02 VITALS — BP 103/76 | HR 89 | Temp 98.1°F | Resp 16 | Wt 107.8 lb

## 2018-01-02 DIAGNOSIS — G90A Postural orthostatic tachycardia syndrome (POTS): Secondary | ICD-10-CM

## 2018-01-02 DIAGNOSIS — R140 Abdominal distension (gaseous): Secondary | ICD-10-CM

## 2018-01-02 DIAGNOSIS — R5383 Other fatigue: Secondary | ICD-10-CM | POA: Insufficient documentation

## 2018-01-02 DIAGNOSIS — R55 Syncope and collapse: Secondary | ICD-10-CM

## 2018-01-02 DIAGNOSIS — I951 Orthostatic hypotension: Principal | ICD-10-CM

## 2018-01-02 DIAGNOSIS — N920 Excessive and frequent menstruation with regular cycle: Secondary | ICD-10-CM

## 2018-01-02 DIAGNOSIS — K769 Liver disease, unspecified: Secondary | ICD-10-CM

## 2018-01-02 DIAGNOSIS — R000 Tachycardia, unspecified: Principal | ICD-10-CM

## 2018-01-02 NOTE — Telephone Encounter (Signed)
Lmov for patient to be seen sooner.   °

## 2018-01-02 NOTE — Telephone Encounter (Signed)
-----   Message from Berniece PapMichelle S Flinchum, FNP sent at 01/02/2018 12:11 PM EDT ----- Dr. Graciela HusbandsKlein,  Im sending you EKG [facility]. She had syncopal episode on 01/02/18 at restaurant - two hours after eating full breakfast. She has had mild fatigue for 2 to 3 weeks. Trying to hydrate taking in 3 liquid IV supplements and 2 to 3 glasses of water. Also with caffienated tea at times. Discussed caffeine and roll in dehydration .She also had heavy menses the day of her syncope. Blood work pending CBC and CMET.  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  Please let me know you received. She will be following up with GYN as well to discuss heavy menstruation.   Thanks, Marvell FullerMichelle Flinchum MSN, AGNP-C, FNP-C

## 2018-01-02 NOTE — Progress Notes (Addendum)
Subjective:     Patient ID: Catherine Mendez, female   DOB: 04/18/1986, 32 y.o.   MRN: [id number]  HPI  Filed Weights   01/02/18 1136  Weight: 107 lb 12.8 oz (48.9 kg)    Blood pressure 103/76, pulse 89, temperature 98.1 F (36.7 C), temperature source Tympanic, resp. rate 16, weight 107 lb 12.8 oz (48.9 kg), last menstrual period 01/01/2018, SpO2 98 %.  A 32 year old female in no acute distress who comes to the clinic for follow-up after she reports she had a syncopal episode on 01/01/18  She reports she was with her husband yesterday and felt like she was fixing to pass out and she then she did. She reports she passed out for around three minutes yesterday.  Her husband caught her. She denies any injury.She had eaten breakfast two hours before being at the  restaurant for lunch where she had syncopal episode. She reports eating  Bacon, eggs, biscuit and water and hash brown that morning.  She was standing in  Line with her husband to get lunch at restaurant sure  When this event happened, and she reports she had the feeling it was getting ready to happen " like I usually do when this happens". She has a history of Post orthostatic tachycardia syndrome (POTS). She denies any other syncopal episodes since one at beginning of the year when in [place].   She currently denies any symptoms. She is not dizzy and denies any dizziness with positional changes.  No seizure activity per husband and EMS per patient. EMS was called to resturant and she was released without being transported to the hospital;. Denies any known seizure activity.   She denies any recent illness or trauma.   She reports she was having heavy menstrual cycle that started on 6//21/19 ( light flow the first three days and then on 01/02/18 heavy flow- she went through 12  tampons yesterday and today she reports her cycle is minimal.. She discussed with Gynecology at most recent visit. She denies any chance of pregnancy.  She says  she has had syncope before with exercise and menstrual cycle in the past.   Dull generalized headache yesterday and today. Denies any vision changes.  Not taking anything for headache " not that bad" sh estates.    She reports she is taking in the liquid IV three times daily, 3 to 4 cups of water and caffeine  tea at times. She denies any blurred vision. She denies any sudden positional changes with syncope.   She emailed Dr. Graciela Husbands via chart own December 15, 2017 with a question of how to manage POTS 01/26/2018, patient was instructed to call the office if the date did not work.   She has not informed the cardiologist of this syncopal episode as of today.  Denies any palpitations.  Patient  denies any fever, body aches,chills, rash, chest pain, shortness of breath, nausea, vomiting, or diarrhea.  She reports she did go for PAP smear with Dr. Scheryl Marten two months ago and all was normal per patients report.   LMP: 12/30/17- present " cycle is normal for me " she reports.   Normal MRI brain 04/2017, seen neurology for chronic migraines.   Review of Systems  Constitutional: Positive for fatigue. Negative for activity change, appetite change, chills, diaphoresis, fever and unexpected weight change.  HENT: Negative.   Respiratory: Negative.   Cardiovascular: Negative.        POTS - Postural Orthostatic Tachycardia Syndrome  Gastrointestinal: Negative.   Endocrine: Negative.   Genitourinary: Negative.        History of liver tumor - mentions needing follow up and provider requests records from patient - she will get [facility]. She reports her OBGYN wants revaluation before clearing her to possible have child in future,   Musculoskeletal: Negative.   Skin: Negative.   Allergic/Immunologic:        -- Augmentin (Amoxicillin-Pot Clavulanate)    --  diarhea  -- Iodine   -- Iodinated Diagnostic Agents -- Rash  -- Shellfish Allergy -- Rash   Neurological: Positive for  syncope (01/01/18) and headaches. Negative for dizziness, tremors, seizures, facial asymmetry, speech difficulty, weakness, light-headedness and numbness.  Hematological: Negative.   Psychiatric/Behavioral: Negative.        Objective:   Physical Exam  Constitutional: She is oriented to person, place, and time. Vital signs are normal. She appears well-developed and well-nourished. No distress.  HENT:  Head: Normocephalic and atraumatic.  Right Ear: Hearing and external ear normal.  Left Ear: Hearing and external ear normal.  Mouth/Throat: Uvula is midline, oropharynx is clear and moist and mucous membranes are normal. No uvula swelling.  Eyes: Pupils are equal, round, and reactive to light. Conjunctivae, EOM and lids are normal.  Neck: Normal range of motion. Neck supple.  Cardiovascular: Normal rate, regular rhythm, normal heart sounds and intact distal pulses. Exam reveals no gallop and no friction rub.  No murmur heard. Pulmonary/Chest: Effort normal and breath sounds normal. No stridor. No respiratory distress. She has no wheezes. She has no rales. She exhibits no tenderness.  Abdominal: Soft. Bowel sounds are normal. She exhibits no distension and no mass. There is no tenderness. There is no rebound and no guarding. No hernia.  Musculoskeletal: Normal range of motion.  Lymphadenopathy:       Head (right side): No submental, no submandibular, no tonsillar, no preauricular, no posterior auricular and no occipital adenopathy present.       Head (left side): No submental, no submandibular, no tonsillar, no preauricular, no posterior auricular and no occipital adenopathy present.    She has no cervical adenopathy.  Neurological: She is alert and oriented to person, place, and time. She has normal strength. She displays normal reflexes. No cranial nerve deficit or sensory deficit. She exhibits normal muscle tone. She displays a negative Romberg sign. Coordination normal. GCS eye subscore is 4.  GCS verbal subscore is 5. GCS motor subscore is 6.  Skin: Skin is warm, dry and intact. Capillary refill takes less than 2 seconds. She is not diaphoretic. No pallor.  Psychiatric: She has a normal mood and affect. Her speech is normal and behavior is normal. Judgment and thought content normal. Cognition and memory are normal.  Vitals reviewed.      Assessment:    POTS (postural orthostatic tachycardia syndrome) - Plan: EKG 12-Lead, CBC w/Diff, Comprehensive metabolic panel  Syncope, unspecified syncope type - Plan: EKG 12-Lead, CBC w/Diff, Comprehensive metabolic panel  Fatigue, unspecified type  Menorrhagia with regular cycle  Results for orders placed or performed in visit on 01/02/18 (from the past 24 hour(s))  CBC w/Diff     Status: None   Collection Time: 01/02/18 11:36 AM  Result Value Ref Range   WBC 3.7 3.4 - 10.8 x10E3/uL   RBC 4.16 3.77 - 5.28 x10E6/uL   Hemoglobin 13.0 11.1 - 15.9 g/dL   Hematocrit 14.737.4 82.934.0 - 46.6 %   MCV 90 79 - 97 fL  MCH 31.3 26.6 - 33.0 pg   MCHC 34.8 31.5 - 35.7 g/dL   RDW 16.1 09.6 - 04.5 %   Platelets 221 150 - 450 x10E3/uL   Neutrophils 40 Not Estab. %   Lymphs 48 Not Estab. %   Monocytes 11 Not Estab. %   Eos 1 Not Estab. %   Basos 0 Not Estab. %   Neutrophils Absolute 1.5 1.4 - 7.0 x10E3/uL   Lymphocytes Absolute 1.8 0.7 - 3.1 x10E3/uL   Monocytes Absolute 0.4 0.1 - 0.9 x10E3/uL   EOS (ABSOLUTE) 0.0 0.0 - 0.4 x10E3/uL   Basophils Absolute 0.0 0.0 - 0.2 x10E3/uL   Immature Granulocytes 0 Not Estab. %   Immature Grans (Abs) 0.0 0.0 - 0.1 x10E3/uL   Narrative   Performed at:  [address] [place], [place]  [id number] Lab Director: Jolene Schimke MD, Phone:  [phone]  Comprehensive metabolic panel     Status: None   Collection Time: 01/02/18 11:36 AM  Result Value Ref Range   Glucose 88 65 - 99 mg/dL   BUN 7 6 - 20 mg/dL   Creatinine, Ser 8.65 0.57 - 1.00 mg/dL   GFR calc non Af Amer 125 >59  mL/min/1.73   GFR calc Af Amer 144 >59 mL/min/1.73   BUN/Creatinine Ratio 12 9 - 23   Sodium 141 134 - 144 mmol/L   Potassium 3.9 3.5 - 5.2 mmol/L   Chloride 104 96 - 106 mmol/L   CO2 24 20 - 29 mmol/L   Calcium 8.9 8.7 - 10.2 mg/dL   Total Protein 6.5 6.0 - 8.5 g/dL   Albumin 4.3 3.5 - 5.5 g/dL   Globulin, Total 2.2 1.5 - 4.5 g/dL   Albumin/Globulin Ratio 2.0 1.2 - 2.2   Bilirubin Total <0.2 0.0 - 1.2 mg/dL   Alkaline Phosphatase 59 39 - 117 IU/L   AST 20 0 - 40 IU/L   ALT 23 0 - 32 IU/L   Narrative   Performed at:  [address] [place], [place]  [id number] Lab Director: Jolene Schimke MD, Phone:  [phone]       Plan:     Orders Placed This Encounter  Procedures  . CBC w/Diff  . Comprehensive metabolic panel  . EKG 12-Lead   Continue to follow Dr. Stevie Kern instructions for POTS. Follow up appointment with Dr. Graciela Husbands Cardiologist will send copy of EKG and labs for his review- discussed with Dr. Sullivan Lone.   Normal CBC and CMET was ordered STAT and results above - released in MY CHART.   Once patinet has seen cardiology will  order CT with contrast of abdomen for history of liver tumor- previous records not available from [facility] - patient  Reports she will call and have records sent. Provider able to see GI PA Tawni Pummel  note on 10/20/16 where GI  reviewed [place] records and HIDA SCAN was normal and she had 2.3 cm benign hemangioma on CT Scan otherwise normal per gastroenterology note.  Advised follow up with gynecology if heavy menses persists and notifying MD of heavy cycles now and in past and amount of tampons pads used and to discuss further testing/ treatments and recommendations.   Orders Placed This Encounter  Procedures  . CT ENTERO ABD/PELVIS W CONTAST    Standing Status:   Future    Standing Expiration Date:   04/07/2019    Order Specific Question:   If indicated for the ordered procedure,  I authorize the administration  of contrast media per Radiology protocol    Answer:   Yes    Comments:   yes patient denies any allergy does report " flush " after at last please evaluate prior     Order Specific Question:   Is patient pregnant?    Answer:   No    Comments:   LMP 12/30/17    Order Specific Question:   Preferred imaging location?    Answer:   [id number]    Order Specific Question:   Call Results- Best Contact Number?    Answer:   [phone]    Order Specific Question:   Radiology Contrast Protocol - do NOT remove file path    Answer:   [email or web address]    Order Specific Question:   ** REASON FOR EXAM (FREE TEXT)    Answer:   history of liver nodulle- no records from [facility] for comparison,abdominal distension, heavy menses, mild abdominal distension on exam, multiople GI symptoms in past  . CBC w/Diff  . Comprehensive metabolic panel  . EKG 12-Lead   She will call if no call from [facility] imaging, Denies any allergies to contrast dye or claustrophobia at office visit.   Provider thoroughly discussed in collaboration above plan with supervising physician Dr. Julieanne Manson who is in agreement with the care plan as above.    She is advised not to try to conceive until she is cleared from cardiology and gynecology as well. Not recommended at this time. She reports she is not trying  to conceive at this time.  Advised patient call the office or your primary care doctor for an appointment if no improvement within 72 hours or if any symptoms change or worsen at any time  Advised ER or urgent Care if after hours or on weekend. Call 911 for emergency symptoms at any time.Patinet verbalized understanding of all instructions given/reviewed and treatment plan and has no further questions or concerns at this time.    Patient verbalized understanding of all instructions given and denies any further questions at this time.

## 2018-01-03 LAB — COMPREHENSIVE METABOLIC PANEL
A/G RATIO: 2 (ref 1.2–2.2)
ALT: 23 IU/L (ref 0–32)
AST: 20 IU/L (ref 0–40)
Albumin: 4.3 g/dL (ref 3.5–5.5)
Alkaline Phosphatase: 59 IU/L (ref 39–117)
BUN/Creatinine Ratio: 12 (ref 9–23)
BUN: 7 mg/dL (ref 6–20)
Bilirubin Total: <0.2 mg/dL (ref 0.0–1.2)
CO2: 24 mmol/L (ref 20–29)
Calcium: 8.9 mg/dL (ref 8.7–10.2)
Chloride: 104 mmol/L (ref 96–106)
Creatinine, Ser: 0.57 mg/dL (ref 0.57–1.00)
GFR calc Af Amer: 144 mL/min/{1.73_m2} (ref >59)
GFR, EST NON AFRICAN AMERICAN: 125 mL/min/{1.73_m2} (ref >59)
GLUCOSE: 88 mg/dL (ref 65–99)
Globulin, Total: 2.2 g/dL (ref 1.5–4.5)
POTASSIUM: 3.9 mmol/L (ref 3.5–5.2)
Sodium: 141 mmol/L (ref 134–144)
TOTAL PROTEIN: 6.5 g/dL (ref 6.0–8.5)

## 2018-01-03 LAB — CBC WITH DIFFERENTIAL/PLATELET
BASOS ABS: 0 10*3/uL (ref 0.0–0.2)
Basos: 0 %
EOS (ABSOLUTE): 0 10*3/uL (ref 0.0–0.4)
Eos: 1 %
Hematocrit: 37.4 % (ref 34.0–46.6)
Hemoglobin: 13 g/dL (ref 11.1–15.9)
IMMATURE GRANS (ABS): 0 10*3/uL (ref 0.0–0.1)
IMMATURE GRANULOCYTES: 0 %
LYMPHS: 48 %
Lymphocytes Absolute: 1.8 10*3/uL (ref 0.7–3.1)
MCH: 31.3 pg (ref 26.6–33.0)
MCHC: 34.8 g/dL (ref 31.5–35.7)
MCV: 90 fL (ref 79–97)
MONOS ABS: 0.4 10*3/uL (ref 0.1–0.9)
Monocytes: 11 %
NEUTROS PCT: 40 %
Neutrophils Absolute: 1.5 10*3/uL (ref 1.4–7.0)
PLATELETS: 221 10*3/uL (ref 150–450)
RBC: 4.16 x10E6/uL (ref 3.77–5.28)
RDW: 13.6 % (ref 12.3–15.4)
WBC: 3.7 10*3/uL (ref 3.4–10.8)

## 2018-01-04 NOTE — Addendum Note (Signed)
Addended by: Berniece PapFLINCHUM, Patric Vanpelt S on: 01/04/2018 11:24 AM   Modules accepted: Orders

## 2018-01-04 NOTE — Progress Notes (Signed)
Patient labs released to my chart. EKG sent to Dr. Graciela HusbandsKlein cardiology - she  Now has appointment 01/05/18 for further evaluation.

## 2018-01-04 NOTE — Telephone Encounter (Signed)
Pt is now coming on 01/05/18 to see Dr Graciela HusbandsKlein

## 2018-01-04 NOTE — Patient Instructions (Signed)
Menorrhagia Menorrhagia is when your menstrual periods are heavy or last longer than usual. Follow these instructions at home:  Only take medicine as told by your doctor.  Take any iron pills as told by your doctor. Heavy bleeding may cause low levels of iron in your body.  Do not take aspirin 1 week before or during your period. Aspirin can make the bleeding worse.  Lie down for a while if you change your tampon or pad more than once in 2 hours. This may help lessen the bleeding.  Eat a healthy diet and foods with iron. These foods include leafy green vegetables, meat, liver, eggs, and whole grain breads and cereals.  Do not try to lose weight. Wait until the heavy bleeding has stopped and your iron level is normal. Contact a doctor if:  You soak through a pad or tampon every 1 or 2 hours, and this happens every time you have a period.  You need to use pads and tampons at the same time because you are bleeding so much.  You need to change your pad or tampon during the night.  You have a period that lasts for more than 8 days.  You pass clots bigger than 1 inch (2.5 cm) wide.  You have irregular periods that happen more or less often than once a month.  You feel dizzy or pass out (faint).  You feel very weak or tired.  You feel short of breath or feel your heart is beating too fast when you exercise.  You feel sick to your stomach (nausea) and you throw up (vomit) while you are taking your medicine.  You have watery poop (diarrhea) while you are taking your medicine.  You have any problems that may be related to the medicine you are taking. Get help right away if:  You soak through 4 or more pads or tampons in 2 hours.  You have any bleeding while you are pregnant. This information is not intended to replace advice given to you by your health care provider. Make sure you discuss any questions you have with your health care provider. Document Released: 04/06/2008 Document  Revised: 12/04/2015 Document Reviewed: 12/28/2012 Elsevier Interactive Patient Education  2017 [employer] Postural Orthostatic Tachycardia Syndrome Postural orthostatic tachycardia syndrome (POTS) is a group of symptoms that can occur when you stand up after lying down. POTS happens when less blood flows to the heart than normal after you stand up. The reduced blood flow to the heart makes the heart beat rapidly. POTS may be associated with another medical condition, or it may occur on its own. What are the causes? This cause of this condition is not known, but many conditions and diseases have been linked to it. What increases the risk? This condition is more likely to develop in:  Women 3415-31 years old.  Women who are pregnant.  Women who are menstruating.  People who have certain conditions, including: ? A viral infection. ? An autoimmune disease. ? Anemia. ? Dehydration. ? Hyperthyroidism.  People who take certain medicines.  People who have had a major injury.  People who have had surgery.  What are the signs or symptoms? The most common symptom of this condition is lightheadedness after standing up from a lying down position. Other symptoms may include:  Feeling a rapid increase in the speed of the heartbeat (tachycardia) within 10 minutes of standing up.  Fainting.  Weakness.  Confusion.  Trembling.  Shortness of breath.  Sweating or flushing.  Headache.  Chest pain.  Breathing that is deeper and faster than normal (hyperventilation).  Nausea.  Anxiety.  Symptoms may be worse in the morning, and they may be relieved by lying down. How is this diagnosed? This condition is diagnosed based on:  Your symptoms.  Your medical history.  A physical exam.  Measurements of your heart rate when you are lying down and after you stand up.  A measurement of your blood pressure. The measurement will be taken when you go from lying down to standing  up.  Blood tests to measure hormones that change with blood pressure. The blood tests will be done when you are lying down and standing up.  You may have other tests to check whether you have a condition or disease that is linked to POTS. How is this treated? Treatment for this condition depends on how severe your symptoms are and whether you have any conditions or diseases that have been linked to POTS. Treatment may involve:  Treating any conditions or diseases that have been linked to POTS.  Drinking two glasses of water before getting up from a lying position.  Eating more salt (sodium).  Taking medicine to control blood pressure and heart rate (beta-blocker).  Taking medicines to control blood flow, blood pressure, or heart rate.  Avoiding certain medicines.  Starting an exercise program under the supervision of your health care provider.  Follow these instructions at home:  Eating and drinking  Drink enough fluid to keep your urine clear or pale yellow.  If told by your health care provider, drink two glasses of water before getting up from a lying position.  Follow instructions from your health care provider about how much sodium you should eat.  Eat several small meals a day instead of a few large meals.  Avoid heavy meals. Medicines  Take over-the-counter and prescription medicines only as told by your health care provider.  Talk with your health care provider before starting any new medicines. Activity  Do an aerobic exercise for 20 minutes a day, at least 3 days a week.  Ask your health care provider what kinds of exercise are safe for you. Contact a health care provider if:  Your symptoms do not improve after treatment.  Your symptoms get worse.  You develop new symptoms. Get help right away if:  You have chest pain.  You have difficulty breathing.  You have fainting episodes. This information is not intended to replace advice given to you by  your health care provider. Make sure you discuss any questions you have with your health care provider. Document Released: 06/18/2002 Document Revised: 08/06/2015 Document Reviewed: 01/10/2015 Elsevier Interactive Patient Education  2018 [employer]. Syncope Syncope is when you lose temporarily pass out (faint). Signs that you may be about to pass out include:  Feeling dizzy or light-headed.  Feeling sick to your stomach (nauseous).  Seeing all white or all black.  Having cold, clammy skin.  If you passed out, get help right away. Call your local emergency services (911 in the [place] Do not drive yourself to the hospital. Follow these instructions at home: Pay attention to any changes in your symptoms. Take these actions to help with your condition:  Have someone stay with you until you feel stable.  Do not drive, use machinery, or play sports until your doctor says it is okay.  Keep all follow-up visits as told by your doctor. This is important.  If you start to feel like you  might pass out, lie down right away and raise (elevate) your feet above the level of your heart. Breathe deeply and steadily. Wait until all of the symptoms are gone.  Drink enough fluid to keep your pee (urine) clear or pale yellow.  If you are taking blood pressure or heart medicine, get up slowly and spend many minutes getting ready to sit and then stand. This can help with dizziness.  Take over-the-counter and prescription medicines only as told by your doctor.  Get help right away if:  You have a very bad headache.  You have unusual pain in your chest, tummy, or back.  You are bleeding from your mouth or rectum.  You have black or tarry poop (stool).  You have a very fast or uneven heartbeat (palpitations).  It hurts to breathe.  You pass out once or more than once.  You have jerky movements that you cannot control (seizure).  You are confused.  You have trouble walking.  You are  very weak.  You have vision problems. These symptoms may be an emergency. Do not wait to see if the symptoms will go away. Get medical help right away. Call your local emergency services (911 in the [place] Do not drive yourself to the hospital. This information is not intended to replace advice given to you by your health care provider. Make sure you discuss any questions you have with your health care provider. Document Released: 12/15/2007 Document Revised: 12/04/2015 Document Reviewed: 03/12/2015 Elsevier Interactive Patient Education  [employer].

## 2018-01-05 ENCOUNTER — Encounter: Payer: Self-pay | Admitting: Internal Medicine

## 2018-01-05 ENCOUNTER — Ambulatory Visit (INDEPENDENT_AMBULATORY_CARE_PROVIDER_SITE_OTHER): Payer: BLUE CROSS/BLUE SHIELD | Admitting: Internal Medicine

## 2018-01-05 ENCOUNTER — Ambulatory Visit (INDEPENDENT_AMBULATORY_CARE_PROVIDER_SITE_OTHER): Payer: BLUE CROSS/BLUE SHIELD

## 2018-01-05 VITALS — BP 98/60 | HR 77 | Ht 59.5 in | Wt 108.0 lb

## 2018-01-05 DIAGNOSIS — R002 Palpitations: Secondary | ICD-10-CM

## 2018-01-05 DIAGNOSIS — G901 Familial dysautonomia [Riley-Day]: Secondary | ICD-10-CM | POA: Diagnosis not present

## 2018-01-05 DIAGNOSIS — Z79899 Other long term (current) drug therapy: Secondary | ICD-10-CM | POA: Diagnosis not present

## 2018-01-05 MED ORDER — FLUDROCORTISONE ACETATE 0.1 MG PO TABS: 0.1000 mg | ORAL_TABLET | Freq: Two times a day (BID) | ORAL | 6 refills | Status: DC

## 2018-01-05 NOTE — Patient Instructions (Addendum)
Medication Instructions: - Your physician has recommended you make the following change in your medication:   1) INCREASE midodrine 5 mg- take 1 tablet by mouth FOUR times a day  2) START florinef 0.1 mg- take 1 tablet by mouth TWICE daily  Labwork: - Your physician recommends that you return for lab work in: 2 weeks- BMP  Procedures/Testing: - Your physician has recommended that you wear a 14 day heart monitor (ZIO patch).  Follow-Up: - Your physician recommends that you schedule a follow-up appointment in: 2 months with Dr. Graciela HusbandsKlein.   Any Additional Special Instructions Will Be Listed Below (If Applicable).  - wear thigh high compression hose    If you need a refill on your cardiac medications before your next appointment, please call your pharmacy.

## 2018-01-05 NOTE — Progress Notes (Signed)
Patient Care Team: Flinchum, Eula Fried, FNP as PCP - General (Family Medicine)   HPI  Catherine Mendez is a 32 y.o. female Seen in follow-up for dysautonomia She has been seen in [place] and [place]. She had a negative tilt table test.  At her last visit we initiated ProAmatine and she had taken previously.  At her last visit we prescribed low-dose beta-blockers.  She has currently stopped her ProAmatine and is using her Inderal only as needed.  This is occurred in the context of having undergone a specialized intensive physical therapy program at [facility].  She has even been able to do a little bit of running.  She is unable to do squats.  We have reviewed the physiology.   She is seen today as an add-on because of her recent episode of syncope.  It was rather stereotypical prodrome but much more abbreviated in that phase and so more sudden.  She had noted significant worsening in her symptoms since the advent of summer.  This included lightheadedness, exercise intolerance.  She is also noted dyspnea associated with eating, no coughing.  She takes baths which she tolerates poorly.  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  She takes her ProAmatine 3 times a day and as it wears off in the evening her symptoms are considerably worse.  Urine color is relatively dark if she is not using her "liquid IV ".  This has about 500 mg of sodium per serving.   19-1/2-year-old daughter is cora         Past Medical History:  Diagnosis Date  . Anemia    in past   . Anxiety    medicine in college for deopression /anxiety   . Blood transfusion without reported diagnosis    2 with childbirth/ choleithias with pregnancy  . Clotting disorder (HCC)   . Elevated liver function tests 06/10/2015  . Factor XII deficiency (HCC)   . Gestational  diabetes 2016  . POTS (postural orthostatic tachycardia syndrome)     Past Surgical History:  Procedure Laterality Date  . COSMETIC SURGERY     dental implant   . MOUTH SURGERY  2013   bottom dental implant     Current Outpatient Medications  Medication Sig Dispense Refill  . Azelastine-Fluticasone (DYMISTA NA) Place into the nose.    . midodrine (PROAMATINE) 5 MG tablet Take 1 tablet (5 mg) by mouth four times a day    . Probiotic Product (PROBIOTIC ADVANCED PO) Take by mouth.    . SUMAtriptan (IMITREX) 100 MG tablet Take 100 mg by mouth every 2 (two) hours as needed.     . fludrocortisone (FLORINEF) 0.1 MG tablet Take 1 tablet (0.1 mg total) by mouth 2 (two) times daily. 60 tablet 6   No current facility-administered medications for this visit.     Allergies  Allergen Reactions  . Augmentin [Amoxicillin-Pot Clavulanate]     diarhea  . Iodine   . Iodinated Diagnostic Agents Rash  . Shellfish Allergy Rash      Review of Systems negative except from HPI and PMH  Physical Exam BP 98/60 (BP Location: Left Arm, Patient Position: Sitting, Cuff Size: Normal)   Pulse 77   Ht 4' 11.5" (1.511 m)   Wt 108 lb (49 kg)   LMP 01/01/2018 (Exact Date)  BMI 21.45 kg/m  Well developed and nourished in no acute distress HENT normal Neck supple with JVP-flat Clear Regular rate and rhythm, no murmurs or gallops Abd-soft with active BS No Clubbing cyanosis edema Skin-warm and dry A & Oriented  Grossly normal sensory and motor function   ECG demonstrates sinus rhythm at 77   Assessment and  Plan  Dysautonomia  Hypotension  Syncope  Stress     Blood pressure remains low.  We will increase her ProAmatine to 4 times a day and will start her on Florinef 100 mcg twice daily.  Encouraged her to continue to push fluid intake and to increase her sodium repletion to 2 g a day.  We reviewed the potential benefits of compressive wear; she is unable to tolerate over the abdomen  compression because of the heat.  Recommend she try thigh sleeves.  She and her husband are obviously struggling with this.  She was in tears periodically; he is anxious.  More than 50% of 40 min was spent in counseling related to the above

## 2018-01-18 ENCOUNTER — Telehealth: Payer: Self-pay | Admitting: Nurse Practitioner

## 2018-01-18 NOTE — Telephone Encounter (Signed)
Phone call to patient to inform patient of instructions for 13 hr prep. Pt verbalized understanding. Prescription called into [facility] in [place]. 1:00am- 50mg  Prednisone 7:00am- 50mg  Prednisone 1:00pm- 50mg  Prednisone and 50mg  Benadryl

## 2018-01-26 ENCOUNTER — Ambulatory Visit: Payer: BLUE CROSS/BLUE SHIELD | Admitting: Internal Medicine

## 2018-01-30 ENCOUNTER — Ambulatory Visit
Admission: RE | Admit: 2018-01-30 | Discharge: 2018-01-30 | Disposition: A | Discharge status: Home / Self Care | Payer: BLUE CROSS/BLUE SHIELD | Source: Ambulatory Visit | Attending: Adult Health | Admitting: Adult Health

## 2018-01-30 DIAGNOSIS — K769 Liver disease, unspecified: Secondary | ICD-10-CM

## 2018-01-30 MED ORDER — IOPAMIDOL (ISOVUE-300) INJECTION 61%
80.0000 mL | Freq: Once | INTRAVENOUS | Status: AC | PRN
  Administered 2018-01-30: 80 mL via INTRAVENOUS

## 2018-02-01 ENCOUNTER — Encounter: Payer: Self-pay | Admitting: Medical

## 2018-02-01 ENCOUNTER — Ambulatory Visit: Payer: Self-pay | Admitting: Medical

## 2018-02-01 VITALS — BP 114/74 | HR 102 | Temp 99.0°F | Resp 16 | Ht 59.0 in | Wt 113.0 lb

## 2018-02-01 DIAGNOSIS — J0110 Acute frontal sinusitis, unspecified: Secondary | ICD-10-CM

## 2018-02-01 MED ORDER — SULFAMETHOXAZOLE-TRIMETHOPRIM 800-160 MG PO TABS: 1.0000 | ORAL_TABLET | Freq: Two times a day (BID) | ORAL | 0 refills | Status: DC

## 2018-02-01 NOTE — Patient Instructions (Addendum)
Sulfamethoxazole; Trimethoprim, SMX-TMP tablets What is this medicine? SULFAMETHOXAZOLE; TRIMETHOPRIM or SMX-TMP (suhl fuh meth OK suh zohl; trye METH oh prim) is a combination of a sulfonamide antibiotic and a second antibiotic, trimethoprim. It is used to treat or prevent certain kinds of bacterial infections. It will not work for colds, flu, or other viral infections. This medicine may be used for other purposes; ask your health care provider or pharmacist if you have questions. COMMON BRAND NAME(S): Bacter-Aid DS, Bactrim, Bactrim DS, Septra, Septra DS What should I tell my health care provider before I take this medicine? They need to know if you have any of these conditions: -anemia -asthma -being treated with anticonvulsants -if you frequently drink alcohol containing drinks -kidney disease -liver disease -low level of folic acid or glucose-6-phosphate dehydrogenase -poor nutrition or malabsorption -porphyria -severe allergies -thyroid disorder -an unusual or allergic reaction to sulfamethoxazole, trimethoprim, sulfa drugs, other medicines, foods, dyes, or preservatives -pregnant or trying to get pregnant -breast-feeding How should I use this medicine? Take this medicine by mouth with a full glass of water. Follow the directions on the prescription label. Take your medicine at regular intervals. Do not take it more often than directed. Do not skip doses or stop your medicine early. Talk to your pediatrician regarding the use of this medicine in children. Special care may be needed. This medicine has been used in children as young as 2 months of age. Overdosage: If you think you have taken too much of this medicine contact a poison control center or emergency room at once. NOTE: This medicine is only for you. Do not share this medicine with others. What if I miss a dose? If you miss a dose, take it as soon as you can. If it is almost time for your next dose, take only that dose. Do  not take double or extra doses. What may interact with this medicine? Do not take this medicine with any of the following medications: -aminobenzoate potassium -dofetilide -metronidazole This medicine may also interact with the following medications: -ACE inhibitors like benazepril, enalapril, lisinopril, and ramipril -birth control pills -cyclosporine -digoxin -diuretics -indomethacin -medicines for diabetes -methenamine -methotrexate -phenytoin -potassium supplements -pyrimethamine -sulfinpyrazone -tricyclic antidepressants -warfarin This list may not describe all possible interactions. Give your health care provider a list of all the medicines, herbs, non-prescription drugs, or dietary supplements you use. Also tell them if you smoke, drink alcohol, or use illegal drugs. Some items may interact with your medicine. What should I watch for while using this medicine? Tell your doctor or health care professional if your symptoms do not improve. Drink several glasses of water a day to reduce the risk of kidney problems. Do not treat diarrhea with over the counter products. Contact your doctor if you have diarrhea that lasts more than 2 days or if it is severe and watery. This medicine can make you more sensitive to the sun. Keep out of the sun. If you cannot avoid being in the sun, wear protective clothing and use a sunscreen. Do not use sun lamps or tanning beds/booths. What side effects may I notice from receiving this medicine? Side effects that you should report to your doctor or health care professional as soon as possible: -allergic reactions like skin rash or hives, swelling of the face, lips, or tongue -breathing problems -fever or chills, sore throat -irregular heartbeat, chest pain -joint or muscle pain -pain or difficulty passing urine -red pinpoint spots on skin -redness, blistering, peeling or loosening of   the skin, including inside the mouth -unusual bleeding or  bruising -unusually weak or tired -yellowing of the eyes or skin Side effects that usually do not require medical attention (report to your doctor or health care professional if they continue or are bothersome): -diarrhea -dizziness -headache -loss of appetite -nausea, vomiting -nervousness This list may not describe all possible side effects. Call your doctor for medical advice about side effects. You may report side effects to FDA at 1-800-FDA-1088. Where should I keep my medicine? Keep out of the reach of children. Store at room temperature between 20 to 25 degrees C (68 to 77 degrees F). Protect from light. Throw away any unused medicine after the expiration date. NOTE: This sheet is a summary. It may not cover all possible information. If you have questions about this medicine, talk to your doctor, pharmacist, or health care provider.  2018 Elsevier/Gold Standard (2013-02-02 14:38:26) Sinusitis, Adult Sinusitis is soreness and inflammation of your sinuses. Sinuses are hollow spaces in the bones around your face. They are located:  Around your eyes.  In the middle of your forehead.  Behind your nose.  In your cheekbones.  Your sinuses and nasal passages are lined with a stringy fluid (mucus). Mucus normally drains out of your sinuses. When your nasal tissues get inflamed or swollen, the mucus can get trapped or blocked so air cannot flow through your sinuses. This lets bacteria, viruses, and funguses grow, and that leads to infection. Follow these instructions at home: Medicines  Take, use, or apply over-the-counter and prescription medicines only as told by your doctor. These may include nasal sprays.  If you were prescribed an antibiotic medicine, take it as told by your doctor. Do not stop taking the antibiotic even if you start to feel better. Hydrate and Humidify  Drink enough water to keep your pee (urine) clear or pale yellow.  Use a cool mist humidifier to keep the  humidity level in your home above 50%.  Breathe in steam for 10-15 minutes, 3-4 times a day or as told by your doctor. You can do this in the bathroom while a hot shower is running.  Try not to spend time in cool or dry air. Rest  Rest as much as possible.  Sleep with your head raised (elevated).  Make sure to get enough sleep each night. General instructions  Put a warm, moist washcloth on your face 3-4 times a day or as told by your doctor. This will help with discomfort.  Wash your hands often with soap and water. If there is no soap and water, use hand sanitizer.  Do not smoke. Avoid being around people who are smoking (secondhand smoke).  Keep all follow-up visits as told by your doctor. This is important. Contact a doctor if:  You have a fever.  Your symptoms get worse.  Your symptoms do not get better within 10 days. Get help right away if:  You have a very bad headache.  You cannot stop throwing up (vomiting).  You have pain or swelling around your face or eyes.  You have trouble seeing.  You feel confused.  Your neck is stiff.  You have trouble breathing. This information is not intended to replace advice given to you by your health care provider. Make sure you discuss any questions you have with your health care provider. Document Released: 12/15/2007 Document Revised: 02/22/2016 Document Reviewed: 04/23/2015 Elsevier Interactive Patient Education  [employer]2018 Elsevier Inc.

## 2018-02-01 NOTE — Progress Notes (Signed)
   Subjective:    Patient ID: Catherine Mendez, female    DOB: [birth year], 32 y.o.   MRN: [id number]  HPI  32 yo female in non acute distress. Presents today with complaints of  Facial  Pressure and forehead and cheekbones  4 days , with yellow discharge x 2 days.Denies fever or chills, mild PND cough non productive and denies chest pain.   Review of Systems  Constitutional: Negative for chills and fever.  HENT: Positive for congestion, ear pain (pressure and popping), postnasal drip, rhinorrhea, sinus pressure, sinus pain, sneezing and sore throat (itchy not sore). Negative for ear discharge.   Eyes: Positive for discharge (left side).  Respiratory: Positive for cough (PND cough). Negative for shortness of breath.   Cardiovascular: Negative for chest pain, palpitations and leg swelling.  Gastrointestinal: Negative for abdominal pain.  Endocrine: Negative for polydipsia, polyphagia and polyuria.  Genitourinary: Negative for dysuria.  Musculoskeletal: Negative for myalgias.  Skin: Negative for rash.  Allergic/Immunologic: Positive for environmental allergies.  Neurological: Positive for headaches. Negative for dizziness, syncope and light-headedness.     [facility]. PA Clydene PughWoodard had gotten the records.    Objective:   Physical Exam  Constitutional: She is oriented to person, place, and time. She appears well-developed and well-nourished.  HENT:  Head: Normocephalic and atraumatic.  Right Ear: External ear normal. A middle ear effusion is present.  Left Ear: External ear normal. A middle ear effusion is present.  Nose: Mucosal edema and rhinorrhea present.  Mouth/Throat: Uvula is midline, oropharynx is clear and moist and mucous membranes are normal. Tonsils are 0 on the right. Tonsils are 0 on the left.  Eyes: Pupils are equal, round, and reactive to light. Conjunctivae, EOM and lids are normal.  Cardiovascular: Normal rate, regular rhythm and normal heart  sounds.  Pulmonary/Chest: Effort normal and breath sounds normal.  Lymphadenopathy:    She has no cervical adenopathy.  Neurological: She is alert and oriented to person, place, and time.  Skin: Skin is warm and dry.  Psychiatric: She has a normal mood and affect. Her behavior is normal. Judgment and thought content normal.  Nursing note and vitals reviewed.  No cough noted in room.       Assessment & Plan:  Sinusitis Meds ordered this encounter  Medications  . sulfamethoxazole-trimethoprim (BACTRIM DS,SEPTRA DS) 800-160 MG tablet    Sig: Take 1 tablet by mouth 2 (two) times daily.    Dispense:  20 tablet    Refill:  0  Rest , increase fluids , return in 3-5 days if not improving. Reviewed CT report with patient of abdomen and pelvis.  Patient to follow up [facility] for review of her CT. And her OB/GYN for review of CT as well. If problem getting in to see PA Clydene PughWoodard to call and we will do a a referral. Patient verbalizes understanding and has no questions at discharge.

## 2018-03-09 ENCOUNTER — Encounter: Payer: Self-pay | Admitting: Internal Medicine

## 2018-03-09 ENCOUNTER — Ambulatory Visit: Payer: BLUE CROSS/BLUE SHIELD | Admitting: Internal Medicine

## 2018-03-09 ENCOUNTER — Encounter: Payer: Self-pay | Admitting: *Deleted

## 2018-03-09 VITALS — BP 105/73 | HR 83 | Ht 59.5 in | Wt 109.5 lb

## 2018-03-09 DIAGNOSIS — Z79899 Other long term (current) drug therapy: Secondary | ICD-10-CM | POA: Diagnosis not present

## 2018-03-09 DIAGNOSIS — G901 Familial dysautonomia [Riley-Day]: Secondary | ICD-10-CM | POA: Diagnosis not present

## 2018-03-09 DIAGNOSIS — I951 Orthostatic hypotension: Secondary | ICD-10-CM | POA: Diagnosis not present

## 2018-03-09 NOTE — Progress Notes (Signed)
Patient Care Team: Flinchum, Eula FriedMichelle S, FNP as PCP - General (Family Medicine)   HPI  Catherine Mendez is a 32 y.o. female Seen in follow-up for dysautonomia She has been seen in [place]. She had a negative tilt table test.  At her last visit we initiated ProAmatine and she had taken previously.  At her last visit we prescribed low-dose beta-blockers.  She has currently stopped her ProAmatine and is using her Inderal only as needed.  This is occurred in the context of having undergone a specialized intensive physical therapy program at [facility].  She has even been able to do a little bit of running.  She is unable to do squats.  We have reviewed the physiology.   She is seen today as an add-on because of her recent episode of syncope.  It was rather stereotypical prodrome but much more abbreviated in that phase and so more sudden.  She had noted significant worsening in her symptoms since the advent of summer.  This included lightheadedness, exercise intolerance.  She is also noted dyspnea associated with eating, no coughing.  She takes baths which she tolerates poorly.  She is considerably better on Florinef.  She was able to be outside with her daughter more.  She had less lightheadedness and less palpitations.  Event recorder had demonstrated palpitations associate with sinus rhythm at various rates.  She had one episode of syncope about 2 weeks ago.  She has not been able to park near her building had to walk more than 20 minutes.  Her normal prodrome was accelerated unable to protect herself   132-1/59-year-old daughter is Ivor Messiercora in daycare [facility] [employer]    Date Cr K  6/19 0.57 3.9            Past Medical History:  Diagnosis Date  . Anemia    in past   . Anxiety    medicine in college for deopression /anxiety   . Blood transfusion without reported diagnosis    2 with childbirth/ choleithias with pregnancy  . Clotting disorder (HCC)   . Elevated  liver function tests 06/10/2015  . Factor XII deficiency (HCC)   . Gestational diabetes 2016  . POTS (postural orthostatic tachycardia syndrome)     Past Surgical History:  Procedure Laterality Date  . COSMETIC SURGERY     dental implant   . MOUTH SURGERY  2013   bottom dental implant     Current Outpatient Medications  Medication Sig Dispense Refill  . Azelastine-Fluticasone (DYMISTA NA) Place into the nose.    . fludrocortisone (FLORINEF) 0.1 MG tablet Take 1 tablet (0.1 mg total) by mouth 2 (two) times daily. 60 tablet 6  . midodrine (PROAMATINE) 5 MG tablet Take 1 tablet (5 mg) by mouth four times a day    . Probiotic Product (PROBIOTIC ADVANCED PO) Take by mouth.    . SUMAtriptan (IMITREX) 100 MG tablet Take 100 mg by mouth every 2 (two) hours as needed.      No current facility-administered medications for this visit.     Allergies  Allergen Reactions  . Augmentin [Amoxicillin-Pot Clavulanate]     diarhea  . Iodine   . Iodinated Diagnostic Agents Rash  . Shellfish-Derived Products Rash      Review of Systems negative except from HPI and PMH  Physical Exam BP 105/73 (BP Location: Right Arm, Patient Position: Sitting, Cuff Size: Normal)   Pulse 83   Ht 4' 11.5" (  1.511 m)   Wt 109 lb 8 oz (49.7 kg)   BMI 21.75 kg/m  Well developed and nourished in no acute distress HENT normal Neck supple with JVP-flat Clear Regular rate and rhythm, no murmurs or gallops Abd-soft with active BS No Clubbing cyanosis edema Skin-warm and dry A & Oriented  Grossly normal sensory and motor function   ECG demonstrates sinus at 83 Intervals 13/07/37 Otherwise normal  Assessment and  Plan  Dysautonomia  Hypotension  Syncope  Stress   Much improved on Florinef.  We will check her electrolytes today.  Needs accommodation for parking.  Have renewed her handicap sticker.  We reiterated the issues of dysautonomia, the physiology of orthstasis and positional stress.  We  discussed the role of salt and water repletion, the importance of exercise,    We spent more than 50% of our >25 min visit in face to face counseling regarding the above

## 2018-03-09 NOTE — Patient Instructions (Addendum)
Medication Instructions: - Your physician recommends that you continue on your current medications as directed. Please refer to the Current Medication list given to you today.  Labwork: - Your physician recommends that you have lab work today: [employer]  Procedures/Testing: - none ordered  Follow-Up: - Your physician wants you to follow-up in: 6 months with Dr. Graciela HusbandsKlein. You should receive a reminder letter in the mail/ call two months in advance. If you don't receive a letter/ call, please call our office to schedule the follow-up appointment.   Any Additional Special Instructions Will Be Listed Below (If Applicable). - Your disability parking placard has been renewed today    If you need a refill on your cardiac medications before your next appointment, please call your pharmacy.

## 2018-03-10 LAB — BASIC METABOLIC PANEL
BUN/Creatinine Ratio: 13 (ref 9–23)
BUN: 10 mg/dL (ref 6–20)
CALCIUM: 9.6 mg/dL (ref 8.7–10.2)
CO2: 24 mmol/L (ref 20–29)
CREATININE: 0.76 mg/dL (ref 0.57–1.00)
Chloride: 102 mmol/L (ref 96–106)
GFR calc Af Amer: 122 mL/min/{1.73_m2} (ref >59)
GFR, EST NON AFRICAN AMERICAN: 106 mL/min/{1.73_m2} (ref >59)
GLUCOSE: 83 mg/dL (ref 65–99)
POTASSIUM: 4.2 mmol/L (ref 3.5–5.2)
SODIUM: 142 mmol/L (ref 134–144)

## 2018-04-19 ENCOUNTER — Ambulatory Visit: Payer: Self-pay | Admitting: Medical

## 2018-04-25 ENCOUNTER — Encounter: Payer: Self-pay | Admitting: Medical

## 2018-04-25 ENCOUNTER — Ambulatory Visit: Payer: Self-pay | Admitting: Medical

## 2018-04-25 VITALS — BP 112/73 | HR 101 | Temp 98.1°F | Resp 16 | Wt 108.8 lb

## 2018-04-25 DIAGNOSIS — H6983 Other specified disorders of Eustachian tube, bilateral: Secondary | ICD-10-CM

## 2018-04-25 MED ORDER — SULFAMETHOXAZOLE-TRIMETHOPRIM 800-160 MG PO TABS: 1.0000 | ORAL_TABLET | Freq: Two times a day (BID) | ORAL | 0 refills | Status: DC

## 2018-04-25 MED ORDER — PREDNISONE 10 MG (21) PO TBPK: ORAL_TABLET | ORAL | 0 refills | Status: DC

## 2018-04-25 NOTE — Patient Instructions (Signed)

## 2018-04-25 NOTE — Progress Notes (Signed)
   Subjective:    Patient ID: Catherine Mendez, female    DOB: 29-Aug-1985, 32 y.o.   MRN: [id number]  HPI 32 yo in non acute distress. Presents with complaints of  Sinus pressure  Right side. X 14 days. Felt somewhat better last week.Cough productive mucus in am green to level.  Tylenol  ER 500mg  at  11:30 am.    Blood pressure 112/73, pulse (!) 101, temperature 98.1 F (36.7 C), temperature source Tympanic, resp. rate 16, weight 108 lb 12.8 oz (49.4 kg), last menstrual period 04/14/2018, SpO2 98 %. Review of Systems  HENT: Positive for congestion, ear pain (right), sinus pressure, sinus pain, sneezing, sore throat (in the mornings) and voice change. Negative for nosebleeds.   Eyes: Positive for discharge (in the mornings).  Respiratory: Positive for cough. Negative for shortness of breath.   Cardiovascular: Negative for chest pain, palpitations and leg swelling.  Gastrointestinal: Negative for abdominal pain.  Genitourinary: Negative for dysuria.  Musculoskeletal: Negative for myalgias.  Skin: Negative for rash.  Allergic/Immunologic: Positive for environmental allergies and food allergies.  Neurological: Negative for dizziness, syncope and light-headedness.  Hematological: Negative for adenopathy.  Psychiatric/Behavioral: Negative for behavioral problems, self-injury and suicidal ideas.       Objective:   Physical Exam  Constitutional: She appears well-developed and well-nourished.  HENT:  Head: Normocephalic.  Right Ear: External ear normal.  Left Ear: External ear normal.  Mouth/Throat: Oropharynx is clear and moist.  Eyes: Pupils are equal, round, and reactive to light. Conjunctivae and EOM are normal.  Neck: Neck supple.  Cardiovascular: Normal rate, regular rhythm and normal heart sounds.  Pulmonary/Chest: Effort normal and breath sounds normal.  Lymphadenopathy:    She has no cervical adenopathy.  Skin: Skin is warm and dry.  Psychiatric: She has a normal mood and  affect. Her behavior is normal. Judgment and thought content normal.  Nursing note and vitals reviewed.         Assessment & Plan:  Sinusitis Meds ordered this encounter  Medications  . sulfamethoxazole-trimethoprim (BACTRIM DS,SEPTRA DS) 800-160 MG tablet    Sig: Take 1 tablet by mouth 2 (two) times daily.    Dispense:  20 tablet    Refill:  0  . predniSONE (STERAPRED UNI-PAK 21 TAB) 10 MG (21) TBPK tablet    Sig: Take 6 tablets by mouth today then 5 tablets tomorrow then one tablet less each day thereafter. Take with food.    Dispense:  21 tablet    Refill:  0  Return in 3 -5 days if not improving or call. Patient verbalizes understanding and has no questions at discharge.

## 2018-05-05 ENCOUNTER — Other Ambulatory Visit: Payer: Self-pay

## 2018-05-05 ENCOUNTER — Observation Stay
Admission: EM | Admit: 2018-05-05 | Discharge: 2018-05-08 | Disposition: A | Discharge status: Home / Self Care | Payer: BLUE CROSS/BLUE SHIELD | Attending: Surgery | Admitting: Surgery

## 2018-05-05 ENCOUNTER — Encounter: Payer: Self-pay | Admitting: Emergency Medicine

## 2018-05-05 DIAGNOSIS — R1011 Right upper quadrant pain: Secondary | ICD-10-CM

## 2018-05-05 DIAGNOSIS — N200 Calculus of kidney: Secondary | ICD-10-CM | POA: Diagnosis not present

## 2018-05-05 DIAGNOSIS — R55 Syncope and collapse: Secondary | ICD-10-CM

## 2018-05-05 DIAGNOSIS — F419 Anxiety disorder, unspecified: Secondary | ICD-10-CM | POA: Insufficient documentation

## 2018-05-05 DIAGNOSIS — R112 Nausea with vomiting, unspecified: Secondary | ICD-10-CM

## 2018-05-05 DIAGNOSIS — R1010 Upper abdominal pain, unspecified: Secondary | ICD-10-CM

## 2018-05-05 DIAGNOSIS — K819 Cholecystitis, unspecified: Secondary | ICD-10-CM | POA: Diagnosis present

## 2018-05-05 DIAGNOSIS — Z880 Allergy status to penicillin: Secondary | ICD-10-CM | POA: Diagnosis not present

## 2018-05-05 DIAGNOSIS — K810 Acute cholecystitis: Secondary | ICD-10-CM | POA: Diagnosis present

## 2018-05-05 DIAGNOSIS — D682 Hereditary deficiency of other clotting factors: Secondary | ICD-10-CM | POA: Insufficient documentation

## 2018-05-05 DIAGNOSIS — K812 Acute cholecystitis with chronic cholecystitis: Secondary | ICD-10-CM | POA: Diagnosis not present

## 2018-05-05 DIAGNOSIS — Z79899 Other long term (current) drug therapy: Secondary | ICD-10-CM | POA: Insufficient documentation

## 2018-05-05 LAB — COMPREHENSIVE METABOLIC PANEL
ALBUMIN: 3.7 g/dL (ref 3.5–5.0)
ALT: 111 U/L — ABNORMAL HIGH (ref 0–44)
AST: 67 U/L — AB (ref 15–41)
Alkaline Phosphatase: 51 U/L (ref 38–126)
Anion gap: 4 — ABNORMAL LOW (ref 5–15)
BUN: 10 mg/dL (ref 6–20)
CHLORIDE: 105 mmol/L (ref 98–111)
CO2: 29 mmol/L (ref 22–32)
Calcium: 8.4 mg/dL — ABNORMAL LOW (ref 8.9–10.3)
Creatinine, Ser: 0.66 mg/dL (ref 0.44–1.00)
GFR calc Af Amer: >60 mL/min (ref >60)
Glucose, Bld: 101 mg/dL — ABNORMAL HIGH (ref 70–99)
POTASSIUM: 4.2 mmol/L (ref 3.5–5.1)
Sodium: 138 mmol/L (ref 135–145)
Total Bilirubin: 0.3 mg/dL (ref 0.3–1.2)
Total Protein: 6.2 g/dL — ABNORMAL LOW (ref 6.5–8.1)

## 2018-05-05 LAB — CBC WITH DIFFERENTIAL/PLATELET
Abs Immature Granulocytes: 0.1 10*3/uL — ABNORMAL HIGH (ref 0.00–0.07)
BASOS ABS: 0 10*3/uL (ref 0.0–0.1)
BASOS PCT: 0 %
EOS ABS: 0.2 10*3/uL (ref 0.0–0.5)
Eosinophils Relative: 1 %
HCT: 37.9 % (ref 36.0–46.0)
Hemoglobin: 12.5 g/dL (ref 12.0–15.0)
IMMATURE GRANULOCYTES: 1 %
Lymphocytes Relative: 49 %
Lymphs Abs: 5.6 10*3/uL — ABNORMAL HIGH (ref 0.7–4.0)
MCH: 30.8 pg (ref 26.0–34.0)
MCHC: 33 g/dL (ref 30.0–36.0)
MCV: 93.3 fL (ref 80.0–100.0)
Monocytes Absolute: 1.1 10*3/uL — ABNORMAL HIGH (ref 0.1–1.0)
Monocytes Relative: 10 %
NEUTROS PCT: 39 %
Neutro Abs: 4.4 10*3/uL (ref 1.7–7.7)
PLATELETS: 265 10*3/uL (ref 150–400)
RBC: 4.06 MIL/uL (ref 3.87–5.11)
RDW: 12.3 % (ref 11.5–15.5)
WBC: 11.4 10*3/uL — ABNORMAL HIGH (ref 4.0–10.5)
nRBC: 0 % (ref 0.0–0.2)

## 2018-05-05 LAB — HCG, QUANTITATIVE, PREGNANCY: hCG, Beta Chain, Quant, S: 1 m[IU]/mL (ref <5)

## 2018-05-05 LAB — TROPONIN I

## 2018-05-05 LAB — LIPASE, BLOOD: LIPASE: 53 U/L — AB (ref 11–51)

## 2018-05-05 MED ORDER — FAMOTIDINE IN NACL 20-0.9 MG/50ML-% IV SOLN
20.0000 mg | Freq: Once | INTRAVENOUS | Status: AC
  Administered 2018-05-05: 20 mg via INTRAVENOUS
  Filled 2018-05-05: qty 50

## 2018-05-05 MED ORDER — FENTANYL CITRATE (PF) 100 MCG/2ML IJ SOLN
50.0000 ug | Freq: Once | INTRAMUSCULAR | Status: AC
  Administered 2018-05-05: 50 ug via INTRAVENOUS
  Filled 2018-05-05: qty 2

## 2018-05-05 MED ORDER — BARIUM SULFATE 2.1 % PO SUSP
90.0000 mL | Freq: Once | ORAL | Status: AC
  Administered 2018-05-05: 90 mL via ORAL

## 2018-05-05 MED ORDER — ONDANSETRON HCL 4 MG/2ML IJ SOLN
INTRAMUSCULAR | Status: AC
  Administered 2018-05-06: 4 mg via INTRAVENOUS
  Filled 2018-05-05: qty 2

## 2018-05-05 MED ORDER — SODIUM CHLORIDE 0.9 % IV BOLUS
1000.0000 mL | Freq: Once | INTRAVENOUS | Status: AC
  Administered 2018-05-05: 1000 mL via INTRAVENOUS

## 2018-05-05 MED ORDER — ONDANSETRON HCL 4 MG/2ML IJ SOLN
4.0000 mg | Freq: Once | INTRAMUSCULAR | Status: AC
  Administered 2018-05-05: 4 mg via INTRAVENOUS
  Filled 2018-05-05: qty 2

## 2018-05-05 NOTE — ED Triage Notes (Signed)
Patient presents to Emergency Department via POV extracted from car due to CO of unconsciousness. Per husband at bedside reporting to EDP, pt recently treated with ABX for sinus infection, and in the last 2 days with complaints of diarrhea and gas with upper abdominal pain.    Pt occasionally arousable to speech

## 2018-05-05 NOTE — ED Provider Notes (Signed)
[facility] Emergency Department Provider Note  ____________________________________________   First MD Initiated Contact with Patient 05/05/18 2124     (approximate)  I have reviewed the triage vital signs and the nursing notes.   HISTORY  Chief Complaint Loss of Consciousness   HPI Catherine Mendez is a 32 y.o. female with history of pots who is presenting to the emergency department today with a syncopal episode as well as abdominal pain and nausea and vomiting.  The patient was originally brought out of the car directly onto a stretcher because she was unconscious while her boyfriend was driving her up to the hospital.  Most of the history obtained from the boyfriend reports that the patient had up until 2 days ago being treated for a sinus infection with steroids as well as antibiotics.  After discontinuing these medications the patient began having upper abdominal pain which is cramping and radiating through to her back.  Patient describes the pain as severe.  Patient then took simethicone approximately 2 hours ago and then began vomiting.  Then proceeded to be in a transient state of consciousness after beginning vomiting which is when she was brought to the hospital by her significant other.  Not reporting any chest pain or shortness of breath at this time.  Patient has had multiple episodes of syncope in the past.    Past Medical History:  Diagnosis Date  . Anemia    in past   . Anxiety    medicine in college for deopression /anxiety   . Blood transfusion without reported diagnosis    2 with childbirth/ choleithias with pregnancy  . Clotting disorder (HCC)   . Elevated liver function tests 06/10/2015  . Factor XII deficiency (HCC)   . Gestational diabetes 2016   gestational  . POTS (postural orthostatic tachycardia syndrome)     Patient Active Problem List   Diagnosis Date Noted  . Syncope 01/02/2018  . Fatigue 01/02/2018  . Menorrhagia with  regular cycle 01/02/2018  . Chronic migraine 06/26/2017  . Generalized anxiety disorder 06/09/2017  . 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 POTS (postural orthostatic tachycardia syndrome) 11/05/2016  . Elevated liver function tests 06/10/2015    Past Surgical History:  Procedure Laterality Date  . COSMETIC SURGERY     dental implant   . MOUTH SURGERY  2013   bottom dental implant     Prior to Admission medications   Medication Sig Start Date End Date Taking? Authorizing Provider  acetaminophen (TYLENOL) 500 MG tablet Take 500 mg by mouth every 6 (six) hours as needed.    [provider]  DYMISTA 137-50 MCG/ACT SUSP Place 2 sprays into both nostrils 2 (two) times daily. 04/12/18   [provider]  fludrocortisone (FLORINEF) 0.1 MG tablet Take 1 tablet (0.1 mg total) by mouth 2 (two) times daily. 01/05/18   Duke Salvia, MD  midodrine (PROAMATINE) 5 MG tablet Take 1 tablet (5 mg) by mouth four times a day    [provider]  predniSONE (STERAPRED UNI-PAK 21 TAB) 10 MG (21) TBPK tablet Take 6 tablets by mouth today then 5 tablets tomorrow then one tablet less each day thereafter. Take with food. 04/25/18   Ratcliffe, Herbert Seta R, PA-C  Probiotic Product (PROBIOTIC ADVANCED PO) Take by mouth.    [provider]  sulfamethoxazole-trimethoprim (BACTRIM DS,SEPTRA DS) 800-160 MG tablet Take 1 tablet by mouth 2 (two) times daily. 04/25/18   Ratcliffe, Herbert Seta R, PA-C  SUMAtriptan (  IMITREX) 100 MG tablet Take 100 mg by mouth every 2 (two) hours as needed.  03/15/17   [provider]    Allergies Augmentin [amoxicillin-pot clavulanate]; Iodinated diagnostic agents; and Shellfish-derived products  Family History  Problem Relation Age of Onset  . Clotting disorder Mother   . Bleeding Disorder Mother   . Fibromyalgia Mother   . Hypertension Father   . Clotting disorder Sister   . Factor VIII deficiency Sister   . Bleeding Disorder Sister   .  Aneurysm Maternal Aunt   . Colon cancer Maternal Grandmother   . Lung cancer Maternal Grandmother   . Colon cancer Maternal Grandfather   . Pancreatic cancer Paternal Grandmother   . Colon cancer Paternal Grandmother   . Heart disease Paternal Grandfather     Social History Social History   Tobacco Use  . Smoking status: Never Smoker  . Smokeless tobacco: Never Used  Substance Use Topics  . Alcohol use: Yes    Comment: wine occassionally  . Drug use: No    Review of Systems  Constitutional: No fever/chills Eyes: No visual changes. ENT: No sore throat. Cardiovascular: Denies chest pain. Respiratory: Denies shortness of breath. Gastrointestinal: No diarrhea.  No constipation. Genitourinary: Negative for dysuria. Musculoskeletal: As above Skin: Negative for rash. Neurological: Negative for headaches, focal weakness or numbness.   ____________________________________________   PHYSICAL EXAM:  VITAL SIGNS: ED Triage Vitals  Enc Vitals Group     BP 05/05/18 2124 99/79     Pulse Rate 05/05/18 2124 80     Resp 05/05/18 2124 18     Temp 05/05/18 2124 98.1 F (36.7 C)     Temp Source 05/05/18 2124 Oral     SpO2 05/05/18 2124 100 %     Weight 05/05/18 2126 110 lb (49.9 kg)     Height 05/05/18 2126 4\' 11"  (1.499 m)     Head Circumference --      Peak Flow --      Pain Score 05/05/18 2125 9     Pain Loc --      Pain Edu? --      Excl. in GC? --     Constitutional: Patient slumped over in wheelchair but wakes up with mild tactile stimulation. Eyes: Conjunctivae are normal.  Head: Atraumatic. Nose: No congestion/rhinnorhea. Mouth/Throat: Mucous membranes are moist.  Neck: No stridor.   Cardiovascular: Normal rate, regular rhythm. Grossly normal heart sounds.   Respiratory: Normal respiratory effort.  No retractions. Lungs CTAB. Gastrointestinal: Soft with moderate upper abdominal tenderness without any focal upper abdominal tenderness.  Negative Murphy sign.  No  distention.  Musculoskeletal: No lower extremity tenderness nor edema.  No joint effusions. Neurologic:  Normal speech and language. No gross focal neurologic deficits are appreciated. Skin:  Skin is warm, dry and intact. No rash noted.   ____________________________________________   LABS (all labs ordered are listed, but only abnormal results are displayed)  Labs Reviewed  CBC WITH DIFFERENTIAL/PLATELET - Abnormal; Notable for the following components:      Result Value   WBC 11.4 (*)    Lymphs Abs 5.6 (*)    Monocytes Absolute 1.1 (*)    Abs Immature Granulocytes 0.10 (*)    All other components within normal limits  COMPREHENSIVE METABOLIC PANEL - Abnormal; Notable for the following components:   Glucose, Bld 101 (*)    Calcium 8.4 (*)    Total Protein 6.2 (*)    AST 67 (*)    ALT  111 (*)    Anion gap 4 (*)    All other components within normal limits  LIPASE, BLOOD - Abnormal; Notable for the following components:   Lipase 53 (*)    All other components within normal limits  TROPONIN I  HCG, QUANTITATIVE, PREGNANCY  URINALYSIS, COMPLETE (UACMP) WITH MICROSCOPIC  URINE DRUG SCREEN, QUALITATIVE ([facility] ONLY)   ____________________________________________  EKG  ED ECG REPORT I, Arelia Longest, the attending physician, personally viewed and interpreted this ECG.   Date: 05/05/2018  EKG Time: 2119  Rate: 86  Rhythm: normal sinus rhythm  Axis: Normal  Intervals:none  ST&T Change: No ST segment elevation or depression.  Abnormal to eversion.  ____________________________________________  RADIOLOGY  Pending CT abdomen at this time. ____________________________________________   PROCEDURES  Procedure(s) performed:   Procedures  Critical Care performed:   ____________________________________________   INITIAL IMPRESSION / ASSESSMENT AND PLAN / ED COURSE  Pertinent labs & imaging results that were available during my care of the patient were reviewed  by me and considered in my medical decision making (see chart for details).  Differential diagnosis includes, but is not limited to, biliary disease (biliary colic, acute cholecystitis, cholangitis, choledocholithiasis, etc), intrathoracic causes for epigastric abdominal pain including ACS, gastritis, duodenitis, pancreatitis, small bowel or large bowel obstruction, abdominal aortic aneurysm, hernia, and ulcer(s).  Syncope, vasovagal syncope As part of my medical decision making, I reviewed the following data within the electronic MEDICAL RECORD NUMBER Notes from prior ED visits  ----------------------------------------- 11:47 PM on 05/05/2018 -----------------------------------------  Patient pending abdominal CT at this time.  Signed with Dr. Lamont Snowball. ____________________________________________   FINAL CLINICAL IMPRESSION(S) / ED DIAGNOSES  Upper abdominal pain.  Syncope.  Nausea and vomiting.  NEW MEDICATIONS STARTED DURING THIS VISIT:  New Prescriptions   No medications on file     Note:  This document was prepared using Dragon voice recognition software and may include unintentional dictation errors.     Myrna Blazer, MD 05/05/18 [phone]

## 2018-05-05 NOTE — ED Notes (Addendum)
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

## 2018-05-06 ENCOUNTER — Emergency Department: Payer: BLUE CROSS/BLUE SHIELD

## 2018-05-06 DIAGNOSIS — K810 Acute cholecystitis: Secondary | ICD-10-CM | POA: Diagnosis present

## 2018-05-06 DIAGNOSIS — D6851 Activated protein C resistance: Secondary | ICD-10-CM

## 2018-05-06 LAB — COMPREHENSIVE METABOLIC PANEL
ALBUMIN: 3.2 g/dL — AB (ref 3.5–5.0)
ALT: 90 U/L — ABNORMAL HIGH (ref 0–44)
ANION GAP: 7 (ref 5–15)
AST: 45 U/L — AB (ref 15–41)
Alkaline Phosphatase: 51 U/L (ref 38–126)
BUN: 6 mg/dL (ref 6–20)
CHLORIDE: 110 mmol/L (ref 98–111)
CO2: 24 mmol/L (ref 22–32)
Calcium: 7.9 mg/dL — ABNORMAL LOW (ref 8.9–10.3)
Creatinine, Ser: 0.56 mg/dL (ref 0.44–1.00)
GFR calc non Af Amer: >60 mL/min (ref >60)
GLUCOSE: 91 mg/dL (ref 70–99)
Potassium: 4.2 mmol/L (ref 3.5–5.1)
Sodium: 141 mmol/L (ref 135–145)
Total Bilirubin: 0.4 mg/dL (ref 0.3–1.2)
Total Protein: 5.8 g/dL — ABNORMAL LOW (ref 6.5–8.1)

## 2018-05-06 LAB — CBC
HEMATOCRIT: 36.3 % (ref 36.0–46.0)
HEMOGLOBIN: 11.9 g/dL — AB (ref 12.0–15.0)
MCH: 31.2 pg (ref 26.0–34.0)
MCHC: 32.8 g/dL (ref 30.0–36.0)
MCV: 95 fL (ref 80.0–100.0)
NRBC: 0 % (ref 0.0–0.2)
Platelets: 230 10*3/uL (ref 150–400)
RBC: 3.82 MIL/uL — AB (ref 3.87–5.11)
RDW: 12.5 % (ref 11.5–15.5)
WBC: 13.1 10*3/uL — AB (ref 4.0–10.5)

## 2018-05-06 LAB — URINALYSIS, COMPLETE (UACMP) WITH MICROSCOPIC
BILIRUBIN URINE: NEGATIVE
Glucose, UA: NEGATIVE mg/dL
Hgb urine dipstick: NEGATIVE
KETONES UR: NEGATIVE mg/dL
LEUKOCYTES UA: NEGATIVE
NITRITE: NEGATIVE
PH: 7 (ref 5.0–8.0)
Protein, ur: NEGATIVE mg/dL
SPECIFIC GRAVITY, URINE: 1.01 (ref 1.005–1.030)

## 2018-05-06 LAB — APTT

## 2018-05-06 LAB — URINE DRUG SCREEN, QUALITATIVE (ARMC ONLY)
Amphetamines, Ur Screen: NOT DETECTED
BARBITURATES, UR SCREEN: NOT DETECTED
BENZODIAZEPINE, UR SCRN: NOT DETECTED
CANNABINOID 50 NG, UR ~~LOC~~: NOT DETECTED
COCAINE METABOLITE, UR ~~LOC~~: NOT DETECTED
MDMA (Ecstasy)Ur Screen: NOT DETECTED
METHADONE SCREEN, URINE: NOT DETECTED
OPIATE, UR SCREEN: NOT DETECTED
Phencyclidine (PCP) Ur S: NOT DETECTED
Tricyclic, Ur Screen: NOT DETECTED

## 2018-05-06 LAB — PROTIME-INR
INR: 0.95
Prothrombin Time: 12.6 seconds (ref 11.4–15.2)

## 2018-05-06 LAB — MAGNESIUM: Magnesium: 2 mg/dL (ref 1.7–2.4)

## 2018-05-06 MED ORDER — FAMOTIDINE IN NACL 20-0.9 MG/50ML-% IV SOLN
20.0000 mg | Freq: Two times a day (BID) | INTRAVENOUS | Status: DC
  Filled 2018-05-06: qty 50

## 2018-05-06 MED ORDER — POLYETHYLENE GLYCOL 3350 17 G PO PACK: 17.0000 g | PACK | Freq: Every day | ORAL | Status: DC | PRN

## 2018-05-06 MED ORDER — LACTATED RINGERS IV SOLN
125.0000 mL/h | INTRAVENOUS | Status: DC
  Administered 2018-05-06 – 2018-05-08 (×6): 125 mL/h via INTRAVENOUS

## 2018-05-06 MED ORDER — HALOPERIDOL LACTATE 5 MG/ML IJ SOLN
5.0000 mg | Freq: Once | INTRAMUSCULAR | Status: AC
  Administered 2018-05-06: 5 mg via INTRAVENOUS
  Filled 2018-05-06: qty 1

## 2018-05-06 MED ORDER — ONDANSETRON 4 MG PO TBDP: 4.0000 mg | ORAL_TABLET | Freq: Four times a day (QID) | ORAL | Status: DC | PRN

## 2018-05-06 MED ORDER — CIPROFLOXACIN IN D5W 400 MG/200ML IV SOLN
400.0000 mg | Freq: Two times a day (BID) | INTRAVENOUS | Status: DC
  Administered 2018-05-07: 400 mg via INTRAVENOUS
  Filled 2018-05-06 (×4): qty 200

## 2018-05-06 MED ORDER — ONDANSETRON HCL 4 MG/2ML IJ SOLN
4.0000 mg | Freq: Once | INTRAMUSCULAR | Status: AC
  Administered 2018-05-06: 4 mg via INTRAVENOUS

## 2018-05-06 MED ORDER — METRONIDAZOLE IN NACL 5-0.79 MG/ML-% IV SOLN
500.0000 mg | Freq: Three times a day (TID) | INTRAVENOUS | Status: DC
  Administered 2018-05-06 – 2018-05-07 (×5): 500 mg via INTRAVENOUS
  Filled 2018-05-06 (×10): qty 100

## 2018-05-06 MED ORDER — CIPROFLOXACIN IN D5W 400 MG/200ML IV SOLN: 400.0000 mg | Freq: Once | INTRAVENOUS | Status: DC

## 2018-05-06 MED ORDER — ENOXAPARIN SODIUM 40 MG/0.4ML ~~LOC~~ SOLN
40.0000 mg | SUBCUTANEOUS | Status: DC
  Filled 2018-05-06: qty 0.4

## 2018-05-06 MED ORDER — SODIUM CHLORIDE 0.9 % IV BOLUS
1000.0000 mL | Freq: Once | INTRAVENOUS | Status: AC
  Administered 2018-05-06: 1000 mL via INTRAVENOUS

## 2018-05-06 MED ORDER — CIPROFLOXACIN IN D5W 400 MG/200ML IV SOLN
400.0000 mg | Freq: Two times a day (BID) | INTRAVENOUS | Status: DC
  Administered 2018-05-06 (×2): 400 mg via INTRAVENOUS
  Filled 2018-05-06 (×4): qty 200

## 2018-05-06 MED ORDER — ONDANSETRON HCL 4 MG/2ML IJ SOLN
4.0000 mg | Freq: Four times a day (QID) | INTRAMUSCULAR | Status: DC | PRN
  Administered 2018-05-07: 4 mg via INTRAVENOUS

## 2018-05-06 MED ORDER — CIPROFLOXACIN IN D5W 400 MG/200ML IV SOLN
400.0000 mg | Freq: Two times a day (BID) | INTRAVENOUS | Status: DC
  Filled 2018-05-06: qty 200

## 2018-05-06 MED ORDER — KETOROLAC TROMETHAMINE 30 MG/ML IJ SOLN
30.0000 mg | Freq: Four times a day (QID) | INTRAMUSCULAR | Status: DC
  Administered 2018-05-06 – 2018-05-08 (×9): 30 mg via INTRAVENOUS
  Filled 2018-05-06 (×9): qty 1

## 2018-05-06 MED ORDER — FAMOTIDINE IN NACL 20-0.9 MG/50ML-% IV SOLN: 20.0000 mg | Freq: Two times a day (BID) | INTRAVENOUS | Status: DC

## 2018-05-06 MED ORDER — HYDROMORPHONE HCL 1 MG/ML IJ SOLN: 0.5000 mg | INTRAMUSCULAR | Status: DC | PRN

## 2018-05-06 NOTE — Consult Note (Signed)
[facility] CONSULT NOTE  Patient Care Team: Flinchum, Eula Fried, FNP as PCP - General (Family Medicine)  CHIEF COMPLAINTS/PURPOSE OF CONSULTATION:  Bleeding diathesis  HISTORY OF PRESENTING ILLNESS:  Catherine Mendez 32 y.o.  female pleasant patient with a history of factor XII deficiency is currently admitted to the hospital for acute cholecystitis awaiting surgery this afternoon/tomorrow.  Hematology has been consulted given her history of elevated PTT.  Patient has previously been evaluated by Dr. Orlie Dakin in the clinic approximately a year ago.  Patient states that she was diagnosed with factor XII deficiency on screening.  Patient states that her sister who is 7 years younger-had history of heavy periods which led to further work-up.  Patient sister-has been diagnosed with mild von Willebrand disease [needing to use desmopressin/Amicar as needed]; also factor XII deficiency; and a "clotting disorder".  Patient sisters has not had any clots.   Patient was initially worked up at [facility] [place]-and was told to have factor XII deficiency.  She had also been checked for von Willebrand-and finally told that she did not have von Willebrand disease.  Patient had dental extraction/tooth implant-without any clinical bleeding.  She did not get any FFP's/Amicar at that time.   Given elevated PTT prior to delivery in at the [facility] [place] , patient was evaluated by hematologist who did not recommend any prior FFP or cryo.  However, when patient needed to be delivered/prior to epidural-she received FFP x1; as her regular hematologist was not available, "just to be on the safe side".  Did not have any significant bleeding issues during delivery/postpartum.  Patient does have a history of POTS-when she has syncopal episodes intermittently.   ROS: A complete 10 point review of system is done which is negative except mentioned above in history of present illness  MEDICAL  HISTORY:  Past Medical History:  Diagnosis Date  . Anemia    in past   . Anxiety    medicine in college for deopression /anxiety   . Blood transfusion without reported diagnosis    2 with childbirth/ choleithias with pregnancy  . Clotting disorder (HCC)   . Elevated liver function tests 06/10/2015  . Factor XII deficiency (HCC)   . Gestational diabetes 2016   gestational  . POTS (postural orthostatic tachycardia syndrome)     SURGICAL HISTORY: Past Surgical History:  Procedure Laterality Date  . COSMETIC SURGERY     dental implant   . MOUTH SURGERY  2013   bottom dental implant     SOCIAL HISTORY: Works at [employer].  No smoking or alcohol.  Has a 12-year-old daughter. Social History   Socioeconomic History  . Marital status: Married    Spouse name: Not on file  . Number of children: Not on file  . Years of education: Not on file  . Highest education level: Not on file  Occupational History  . Not on file  Social Needs  . Financial resource strain: Not on file  . Food insecurity:    Worry: Not on file    Inability: Not on file  . Transportation needs:    Medical: Not on file    Non-medical: Not on file  Tobacco Use  . Smoking status: Never Smoker  . Smokeless tobacco: Never Used  Substance and Sexual Activity  . Alcohol use: Yes    Comment: wine occassionally  . Drug use: No  . Sexual activity: Yes    Birth control/protection: Condom  Lifestyle  .  Physical activity:    Days per week: Not on file    Minutes per session: Not on file  . Stress: Not on file  Relationships  . Social connections:    Talks on phone: Not on file    Gets together: Not on file    Attends religious service: Not on file    Active member of club or organization: Not on file    Attends meetings of clubs or organizations: Not on file    Relationship status: Not on file  . Intimate partner violence:    Fear of current or ex partner: Not on file    Emotionally abused: Not on  file    Physically abused: Not on file    Forced sexual activity: Not on file  Other Topics Concern  . Not on file  Social History Narrative   ** Merged History Encounter **        FAMILY HISTORY: Family History  Problem Relation Age of Onset  . Clotting disorder Mother   . Bleeding Disorder Mother   . Fibromyalgia Mother   . Hypertension Father   . Clotting disorder Sister   . Factor VIII deficiency Sister   . Bleeding Disorder Sister   . Aneurysm Maternal Aunt   . Colon cancer Maternal Grandmother   . Lung cancer Maternal Grandmother   . Colon cancer Maternal Grandfather   . Pancreatic cancer Paternal Grandmother   . Colon cancer Paternal Grandmother   . Heart disease Paternal Grandfather     ALLERGIES:  is allergic to augmentin [amoxicillin-pot clavulanate]; iodinated diagnostic agents; and shellfish-derived products.  MEDICATIONS:  Current Facility-Administered Medications  Medication Dose Route Frequency Provider Last Rate Last Dose  . ciprofloxacin (CIPRO) IVPB 400 mg  400 mg Intravenous Q12H Piscoya, Jose, MD      . enoxaparin (LOVENOX) injection 40 mg  40 mg Subcutaneous Q24H Piscoya, Jose, MD      . famotidine (PEPCID) IVPB 20 mg premix  20 mg Intravenous Q12H Piscoya, Jose, MD      . HYDROmorphone (DILAUDID) injection 0.5 mg  0.5 mg Intravenous Q4H PRN Piscoya, Jose, MD      . ketorolac (TORADOL) 30 MG/ML injection 30 mg  30 mg Intravenous Q6H Piscoya, Jose, MD   30 mg at 05/06/18 0640  . lactated ringers infusion  125 mL/hr Intravenous Continuous Henrene Dodge, MD 125 mL/hr at 05/06/18 0617 125 mL/hr at 05/06/18 0617  . metroNIDAZOLE (FLAGYL) IVPB 500 mg  500 mg Intravenous Q8H Piscoya, Jose, MD 100 mL/hr at 05/06/18 0854 500 mg at 05/06/18 0854  . ondansetron (ZOFRAN-ODT) disintegrating tablet 4 mg  4 mg Oral Q6H PRN Piscoya, Jose, MD       Or  . ondansetron (ZOFRAN) injection 4 mg  4 mg Intravenous Q6H PRN Piscoya, Jose, MD      . polyethylene glycol (MIRALAX  / GLYCOLAX) packet 17 g  17 g Oral Daily PRN Piscoya, Jose, MD          .  PHYSICAL EXAMINATION:  Vitals:   05/06/18 0326 05/06/18 0600  BP: 94/60 101/60  Pulse: 86 82  Resp: 16 20  Temp:  98.3 F (36.8 C)  SpO2: 98% 100%   Filed Weights   05/05/18 2126 05/06/18 0600  Weight: 110 lb (49.9 kg) 113 lb 15.7 oz (51.7 kg)    GENERAL: Well-nourished well-developed; Alert, no distress and comfortable.  Accompanied by husband. EYES: no pallor or icterus OROPHARYNX: no thrush or ulceration. NECK: supple, no  masses felt LYMPH:  no palpable lymphadenopathy in the cervical, axillary or inguinal regions LUNGS: decreased breath sounds to auscultation at bases and  No wheeze or crackles HEART/CVS: regular rate & rhythm and no murmurs; No lower extremity edema ABDOMEN: abdomen soft, non-tender and normal bowel sounds Musculoskeletal:no cyanosis of digits and no clubbing  PSYCH: alert & oriented x 3 with fluent speech NEURO: no focal motor/sensory deficits SKIN:  no rashes or significant lesions  LABORATORY DATA:  I have reviewed the data as listed Lab Results  Component Value Date   WBC 13.1 (H) 05/06/2018   HGB 11.9 (L) 05/06/2018   HCT 36.3 05/06/2018   MCV 95.0 05/06/2018   PLT 230 05/06/2018   Recent Labs    01/02/18 1136 03/09/18 1102 05/05/18 2131 05/06/18 0547  NA 141 142 138 141  K 3.9 4.2 4.2 4.2  CL 104 102 105 110  CO2 24 24 29 24   GLUCOSE 88 83 101* 91  BUN 7 10 10 6   CREATININE 0.57 0.76 0.66 0.56  CALCIUM 8.9 9.6 8.4* 7.9*  GFRNONAA 125 106 >60 >60  GFRAA 144 122 >60 >60  PROT 6.5  --  6.2* 5.8*  ALBUMIN 4.3  --  3.7 3.2*  AST 20  --  67* 45*  ALT 23  --  111* 90*  ALKPHOS 59  --  51 51  BILITOT <0.2  --  0.3 0.4    RADIOGRAPHIC STUDIES: I have personally reviewed the radiological images as listed and agreed with the findings in the report. Ct Abdomen Pelvis Wo Contrast  Result Date: 05/06/2018 CLINICAL DATA:  32 year old female with abdominal  pain. EXAM: CT ABDOMEN AND PELVIS WITHOUT CONTRAST TECHNIQUE: Multidetector CT imaging of the abdomen and pelvis was performed following the standard protocol without IV contrast. COMPARISON:  CT of the abdomen pelvis dated 01/30/2018 FINDINGS: Evaluation of this exam is limited in the absence of intravenous contrast. Lower chest: The visualized lung bases are clear. No intra-abdominal free air or free fluid. Hepatobiliary: A 3 cm hypodense lesion in the right lobe of the liver appears similar to prior CT. This is not characterized on today's non-contrast CT but previously suggested to represent a hemangioma. A subcentimeter hypodense lesion in the dome of the liver is not characterized. No intrahepatic biliary ductal dilatation. No calcified gallstone identified. There is however slight thickened appearance of the gallbladder wall. Ultrasound may provide better evaluation of the gallbladder. Pancreas: Unremarkable. No pancreatic ductal dilatation or surrounding inflammatory changes. Spleen: A 1.8 cm hypodense lesion arising from the inferior pole of the spleen appears similar to prior CT, indeterminate, possibly a cyst or hemangioma. Adrenals/Urinary Tract: The adrenal glands are unremarkable. There is a 3 mm nonobstructing right renal inferior pole calculus. The left kidney is unremarkable. The visualized ureters and urinary bladder appear unremarkable as well. Stomach/Bowel: Mildly thickened appearance of the jejunal folds in the left hemiabdomen likely related to underdistention. No bowel obstruction or active inflammation. Normal appendix. Vascular/Lymphatic: The abdominal aorta and IVC are grossly unremarkable on this noncontrast CT. No portal venous gas. There is no adenopathy. Reproductive: The uterus is retroflexed. The ovaries are grossly unremarkable as visualized. Other: None Musculoskeletal: No acute or significant osseous findings. IMPRESSION: 1. Mildly thickened appearing gallbladder wall. Further  evaluation with ultrasound is recommended. 2. No bowel obstruction or active inflammation. Normal appendix. 3. A 3 mm nonobstructing right renal inferior pole calculus. No hydronephrosis. Electronically Signed   By: Elgie Collard M.D.   On:  05/06/2018 00:40   US Abdomen Limited Ruq  Result Date: 05/06/2018 CLINICAL DATA:  RIGHT upper quadrant pain. Follow-up of normal gallbladder. EXAM: ULTRASOUND ABDOMEN LIMITED RIGHT UPPER QUADRANT COMPARISON:  CT abdomen and pelvis May 06, 2018 FINDINGS: Gallbladder: Gallbladder wall is thickened at 7 mm. Layering gallbladder sludge and trace pericholecystic fluid. No echogenic cholelithiasis. No sonographic Murphy sign elicited. Common bile duct: Diameter: 2 mm Liver: Echogenic 3 cm mass RIGHT lobe of the liver most compatible with hemangioma. Within normal limits in parenchymal echogenicity. Portal vein is patent on color Doppler imaging with normal direction of blood flow towards the liver. IMPRESSION: 1. Sonographic findings of acute cholecystitis without sonographic Murphy sign, however patient received pain medication. 2. Acute findings discussed with and reconfirmed by Dr.NEIL RIFENBARK on 05/06/2018 at 2:08 am. Electronically Signed   By: Awilda Metro M.D.   On: 05/06/2018 02:08    ASSESSMENT & PLAN:   #32 year old female patient with a history of factor XII deficiency/elevated PTT is currently admitted to hospital for acute cholecystitis needing surgery  #Elevated PTT/severe factor XII deficiency-patient clinically has not had any significant episodes of bleeding.  She had dental extractions done without any significant bleeding issues.  Clinically this is reassuring.  Factor XII deficiency-causes laboratory abnormalities with elevated PTT; but clinically does not cause bleeding diathesis as other coagulation factors compensated for factor XII deficiency.  Patient can proceed her gallbladder surgery; and does not need any FFP or any other blood  products based on clinical decision.   #With regards to DVT prophylaxis-patient should get standard DVT prophylaxis while in hospital.  At discharge patient is still a candidate for DVT prophylaxis if needed from surgical standpoint.. However, patient is concerned about history of dysautonomia/passing out spells which is a major concern if patient is on anticoagulation.  I left a message to discuss with Dr. Aleen Campi   #I had a long discussion the patient/and family regarding my above plan of care.  They seem very knowledgeable/given previous interactions/recommendations from hematologists.   Thank you Dr.Piscoya for allowing me to participate in the care of your pleasant patient. Please do not hesitate to contact me with questions or concerns in the interim.    All questions were answered. The patient knows to call the clinic with any problems, questions or concerns.    Earna Coder, MD 05/06/2018 10:40 AM

## 2018-05-06 NOTE — Anesthesia Preprocedure Evaluation (Addendum)
Anesthesia Evaluation  Patient identified by MRN, date of birth, ID band Patient awake    Reviewed: Allergy & Precautions, H&P , NPO status , Patient's Chart, lab work & pertinent test results, reviewed documented beta blocker date and time   History of Anesthesia Complications Negative for: history of anesthetic complications  Airway Mallampati: I  TM Distance: >3 FB Neck ROM: full    Dental  (+) Implants, Dental Advidsory Given, Teeth Intact   Pulmonary neg pulmonary ROS,           Cardiovascular Exercise Tolerance: Good negative cardio ROS       Neuro/Psych  Headaches, neg Seizures PSYCHIATRIC DISORDERS Anxiety negative psych ROS   GI/Hepatic Neg liver ROS, GERD  ,  Endo/Other  negative endocrine ROS  Renal/GU negative Renal ROS  negative genitourinary   Musculoskeletal   Abdominal   Peds  Hematology  (+) Blood dyscrasia, anemia ,   Anesthesia Other Findings Past Medical History: No date: Anemia     Comment:  in past  No date: Anxiety     Comment:  medicine in college for deopression /anxiety  No date: Blood transfusion without reported diagnosis     Comment:  2 with childbirth/ choleithias with pregnancy No date: Clotting disorder (HCC) 06/10/2015: Elevated liver function tests No date: Factor XII deficiency (HCC) 2016: Gestational diabetes     Comment:  gestational No date: POTS (postural orthostatic tachycardia syndrome)   Reproductive/Obstetrics negative OB ROS                            Anesthesia Physical Anesthesia Plan  ASA: II  Anesthesia Plan: General   Post-op Pain Management:    Induction: Intravenous  PONV Risk Score and Plan: 3 and Ondansetron, Dexamethasone, Midazolam, Treatment may vary due to age or medical condition and Promethazine  Airway Management Planned: Oral ETT  Additional Equipment:   Intra-op Plan:   Post-operative Plan: Extubation in  OR  Informed Consent: I have reviewed the patients History and Physical, chart, labs and discussed the procedure including the risks, benefits and alternatives for the proposed anesthesia with the patient or authorized representative who has indicated his/her understanding and acceptance.   Dental Advisory Given  Plan Discussed with: Anesthesiologist, CRNA and Surgeon  Anesthesia Plan Comments:         Anesthesia Quick Evaluation

## 2018-05-06 NOTE — H&P (Signed)
Date of Admission:  05/06/2018  Reason for Admission:  Acute cholecystitis  History of Present Illness: Catherine Mendez is a 32 y.o. female presenting with a one day history of significant right upper quadrant pain that started last night.  Patient reports also episodes of nausea and vomiting at home in the emergency room.  Denies any fevers or chills.  She has had several episodes of pain in the past and in her previous pregnancy was informed about her gallbladder issues but no surgery plans were made.  Sent to the emergency room and on her work-up was found to have gallbladder wall thickening on CT scan and confirmed by ultrasound.  She also had trace pericholecystic fluid.  Labs show white count of 11.4 and AST of 67 ALT of 111.  Lipase was mildly elevated at 53.  Past Medical History: Past Medical History:  Diagnosis Date  . Anemia    in past   . Anxiety    medicine in college for deopression /anxiety   . Blood transfusion without reported diagnosis    2 with childbirth/ choleithias with pregnancy  . Clotting disorder (HCC)   . Elevated liver function tests 06/10/2015  . Factor XII deficiency (HCC)   . Gestational diabetes 2016   gestational  . POTS (postural orthostatic tachycardia syndrome)      Past Surgical History: Past Surgical History:  Procedure Laterality Date  . COSMETIC SURGERY     dental implant   . MOUTH SURGERY  2013   bottom dental implant     Home Medications: Prior to Admission medications   Medication Sig Start Date End Date Taking? Authorizing Provider  acetaminophen (TYLENOL) 500 MG tablet Take 500 mg by mouth every 6 (six) hours as needed.   Yes [provider]  DYMISTA 137-50 MCG/ACT SUSP Place 2 sprays into both nostrils 2 (two) times daily. 04/12/18  Yes [provider]  predniSONE (STERAPRED UNI-PAK 21 TAB) 10 MG (21) TBPK tablet Take 6 tablets by mouth today then 5 tablets tomorrow then one tablet less each day thereafter. Take  with food. 04/25/18  Yes Ratcliffe, Heather R, PA-C  Probiotic Product (PROBIOTIC ADVANCED PO) Take by mouth.   Yes [provider]  sulfamethoxazole-trimethoprim (BACTRIM DS,SEPTRA DS) 800-160 MG tablet Take 1 tablet by mouth 2 (two) times daily. 04/25/18  Yes Ratcliffe, Heather R, PA-C  SUMAtriptan (IMITREX) 100 MG tablet Take 100 mg by mouth every 2 (two) hours as needed.  03/15/17  Yes [provider]  fludrocortisone (FLORINEF) 0.1 MG tablet Take 1 tablet (0.1 mg total) by mouth 2 (two) times daily. Patient not taking: Reported on 05/06/2018 01/05/18   Duke Salvia, MD  midodrine (PROAMATINE) 5 MG tablet Take 1 tablet (5 mg) by mouth four times a day    [provider]    Allergies: Allergies  Allergen Reactions  . Augmentin [Amoxicillin-Pot Clavulanate]     diarhea  . Iodinated Diagnostic Agents Rash  . Shellfish-Derived Products Rash    Social History:  reports that she has never smoked. She has never used smokeless tobacco. She reports that she drinks alcohol. She reports that she does not use drugs.   Family History: Family History  Problem Relation Age of Onset  . Clotting disorder Mother   . Bleeding Disorder Mother   . Fibromyalgia Mother   . Hypertension Father   . Clotting disorder Sister   . Factor VIII deficiency Sister   . Bleeding Disorder Sister   . Aneurysm  Maternal Aunt   . Colon cancer Maternal Grandmother   . Lung cancer Maternal Grandmother   . Colon cancer Maternal Grandfather   . Pancreatic cancer Paternal Grandmother   . Colon cancer Paternal Grandmother   . Heart disease Paternal Grandfather     Review of Systems: Review of Systems  Constitutional: Negative for chills and fever.  HENT: Negative for hearing loss.   Eyes: Negative for blurred vision.  Respiratory: Negative for cough and shortness of breath.   Cardiovascular: Negative for chest pain.  Gastrointestinal: Positive for abdominal pain, nausea and vomiting.  Negative for constipation and diarrhea.  Genitourinary: Negative for dysuria.  Musculoskeletal: Negative for myalgias.  Skin: Negative for rash.  Neurological: Negative for dizziness.  Psychiatric/Behavioral: Negative for depression.    Physical Exam BP (!) 89/60   Pulse 81   Temp 98.5 F (36.9 C) (Oral)   Resp 15   Ht 4\' 11"  (1.499 m)   Wt 51.7 kg   LMP 04/14/2018 Comment: neg hcg  SpO2 100%   BMI 23.02 kg/m  CONSTITUTIONAL: No acute distress HEENT:  Normocephalic, atraumatic, extraocular motion intact. NECK: Trachea is midline, and there is no jugular venous distension.  RESPIRATORY:  Lungs are clear, and breath sounds are equal bilaterally. Normal respiratory effort without pathologic use of accessory muscles. CARDIOVASCULAR: Heart is regular without murmurs, gallops, or rubs. GI: The abdomen is soft, nondistended, with tenderness to palpation in the right upper quadrant.  At this point she has a negative Murphy sign but she has received pain medication.  There were no palpable masses. There was no hepatosplenomegaly. MUSCULOSKELETAL:  Normal muscle strength and tone in all four extremities.  No peripheral edema or cyanosis. SKIN: Skin turgor is normal. There are no pathologic skin lesions.  NEUROLOGIC:  Motor and sensation is grossly normal.  Cranial nerves are grossly intact. PSYCH:  Alert and oriented to person, place and time. Affect is normal.  Laboratory Analysis: Results for orders placed or performed during the hospital encounter of 05/05/18 (from the past 24 hour(s))  CBC with Differential     Status: Abnormal   Collection Time: 05/05/18  9:31 PM  Result Value Ref Range   WBC 11.4 (H) 4.0 - 10.5 K/uL   RBC 4.06 3.87 - 5.11 MIL/uL   Hemoglobin 12.5 12.0 - 15.0 g/dL   HCT 16.1 09.6 - 04.5 %   MCV 93.3 80.0 - 100.0 fL   MCH 30.8 26.0 - 34.0 pg   MCHC 33.0 30.0 - 36.0 g/dL   RDW 40.9 81.1 - 91.4 %   Platelets 265 150 - 400 K/uL   nRBC 0.0 0.0 - 0.2 %    Neutrophils Relative % 39 %   Neutro Abs 4.4 1.7 - 7.7 K/uL   Lymphocytes Relative 49 %   Lymphs Abs 5.6 (H) 0.7 - 4.0 K/uL   Monocytes Relative 10 %   Monocytes Absolute 1.1 (H) 0.1 - 1.0 K/uL   Eosinophils Relative 1 %   Eosinophils Absolute 0.2 0.0 - 0.5 K/uL   Basophils Relative 0 %   Basophils Absolute 0.0 0.0 - 0.1 K/uL   Immature Granulocytes 1 %   Abs Immature Granulocytes 0.10 (H) 0.00 - 0.07 K/uL  Comprehensive metabolic panel     Status: Abnormal   Collection Time: 05/05/18  9:31 PM  Result Value Ref Range   Sodium 138 135 - 145 mmol/L   Potassium 4.2 3.5 - 5.1 mmol/L   Chloride 105 98 - 111 mmol/L  CO2 29 22 - 32 mmol/L   Glucose, Bld 101 (H) 70 - 99 mg/dL   BUN 10 6 - 20 mg/dL   Creatinine, Ser 1.61 0.44 - 1.00 mg/dL   Calcium 8.4 (L) 8.9 - 10.3 mg/dL   Total Protein 6.2 (L) 6.5 - 8.1 g/dL   Albumin 3.7 3.5 - 5.0 g/dL   AST 67 (H) 15 - 41 U/L   ALT 111 (H) 0 - 44 U/L   Alkaline Phosphatase 51 38 - 126 U/L   Total Bilirubin 0.3 0.3 - 1.2 mg/dL   GFR calc non Af Amer >60 >60 mL/min   GFR calc Af Amer >60 >60 mL/min   Anion gap 4 (L) 5 - 15  Lipase, blood     Status: Abnormal   Collection Time: 05/05/18  9:31 PM  Result Value Ref Range   Lipase 53 (H) 11 - 51 U/L  Troponin I     Status: None   Collection Time: 05/05/18  9:31 PM  Result Value Ref Range   Troponin I <0.03 <0.03 ng/mL  hCG, quantitative, pregnancy     Status: None   Collection Time: 05/05/18  9:31 PM  Result Value Ref Range   hCG, Beta Chain, Quant, S 1 <5 mIU/mL  Urinalysis, Complete w Microscopic     Status: Abnormal   Collection Time: 05/06/18  3:28 AM  Result Value Ref Range   Color, Urine STRAW (A) YELLOW   APPearance CLEAR (A) CLEAR   Specific Gravity, Urine 1.010 1.005 - 1.030   pH 7.0 5.0 - 8.0   Glucose, UA NEGATIVE NEGATIVE mg/dL   Hgb urine dipstick NEGATIVE NEGATIVE   Bilirubin Urine NEGATIVE NEGATIVE   Ketones, ur NEGATIVE NEGATIVE mg/dL   Protein, ur NEGATIVE NEGATIVE mg/dL    Nitrite NEGATIVE NEGATIVE   Leukocytes, UA NEGATIVE NEGATIVE   RBC / HPF 0-5 0 - 5 RBC/hpf   WBC, UA 0-5 0 - 5 WBC/hpf   Bacteria, UA RARE (A) NONE SEEN   Squamous Epithelial / LPF 0-5 0 - 5   Mucus PRESENT   Urine Drug Screen, Qualitative ([facility] only)     Status: None   Collection Time: 05/06/18  3:28 AM  Result Value Ref Range   Tricyclic, Ur Screen NONE DETECTED NONE DETECTED   Amphetamines, Ur Screen NONE DETECTED NONE DETECTED   MDMA (Ecstasy)Ur Screen NONE DETECTED NONE DETECTED   Cocaine Metabolite,Ur [place] NONE DETECTED NONE DETECTED   Opiate, Ur Screen NONE DETECTED NONE DETECTED   Phencyclidine (PCP) Ur S NONE DETECTED NONE DETECTED   Cannabinoid 50 Ng, Ur [place] NONE DETECTED NONE DETECTED   Barbiturates, Ur Screen NONE DETECTED NONE DETECTED   Benzodiazepine, Ur Scrn NONE DETECTED NONE DETECTED   Methadone Scn, Ur NONE DETECTED NONE DETECTED  Magnesium     Status: None   Collection Time: 05/06/18  5:47 AM  Result Value Ref Range   Magnesium 2.0 1.7 - 2.4 mg/dL  CBC     Status: Abnormal   Collection Time: 05/06/18  5:47 AM  Result Value Ref Range   WBC 13.1 (H) 4.0 - 10.5 K/uL   RBC 3.82 (L) 3.87 - 5.11 MIL/uL   Hemoglobin 11.9 (L) 12.0 - 15.0 g/dL   HCT 09.6 04.5 - 40.9 %   MCV 95.0 80.0 - 100.0 fL   MCH 31.2 26.0 - 34.0 pg   MCHC 32.8 30.0 - 36.0 g/dL   RDW 81.1 91.4 - 78.2 %   Platelets 230 150 -  400 K/uL   nRBC 0.0 0.0 - 0.2 %  Comprehensive metabolic panel     Status: Abnormal   Collection Time: 05/06/18  5:47 AM  Result Value Ref Range   Sodium 141 135 - 145 mmol/L   Potassium 4.2 3.5 - 5.1 mmol/L   Chloride 110 98 - 111 mmol/L   CO2 24 22 - 32 mmol/L   Glucose, Bld 91 70 - 99 mg/dL   BUN 6 6 - 20 mg/dL   Creatinine, Ser 1.61 0.44 - 1.00 mg/dL   Calcium 7.9 (L) 8.9 - 10.3 mg/dL   Total Protein 5.8 (L) 6.5 - 8.1 g/dL   Albumin 3.2 (L) 3.5 - 5.0 g/dL   AST 45 (H) 15 - 41 U/L   ALT 90 (H) 0 - 44 U/L   Alkaline Phosphatase 51 38 - 126 U/L   Total Bilirubin  0.4 0.3 - 1.2 mg/dL   GFR calc non Af Amer >60 >60 mL/min   GFR calc Af Amer >60 >60 mL/min   Anion gap 7 5 - 15  Protime-INR     Status: None   Collection Time: 05/06/18  5:47 AM  Result Value Ref Range   Prothrombin Time 12.6 11.4 - 15.2 seconds   INR 0.95   APTT     Status: Abnormal   Collection Time: 05/06/18  5:47 AM  Result Value Ref Range   aPTT >160 (HH) 24 - 36 seconds    Imaging: Ct Abdomen Pelvis Wo Contrast  Result Date: 05/06/2018 CLINICAL DATA:  32 year old female with abdominal pain. EXAM: CT ABDOMEN AND PELVIS WITHOUT CONTRAST TECHNIQUE: Multidetector CT imaging of the abdomen and pelvis was performed following the standard protocol without IV contrast. COMPARISON:  CT of the abdomen pelvis dated 01/30/2018 FINDINGS: Evaluation of this exam is limited in the absence of intravenous contrast. Lower chest: The visualized lung bases are clear. No intra-abdominal free air or free fluid. Hepatobiliary: A 3 cm hypodense lesion in the right lobe of the liver appears similar to prior CT. This is not characterized on today's non-contrast CT but previously suggested to represent a hemangioma. A subcentimeter hypodense lesion in the dome of the liver is not characterized. No intrahepatic biliary ductal dilatation. No calcified gallstone identified. There is however slight thickened appearance of the gallbladder wall. Ultrasound may provide better evaluation of the gallbladder. Pancreas: Unremarkable. No pancreatic ductal dilatation or surrounding inflammatory changes. Spleen: A 1.8 cm hypodense lesion arising from the inferior pole of the spleen appears similar to prior CT, indeterminate, possibly a cyst or hemangioma. Adrenals/Urinary Tract: The adrenal glands are unremarkable. There is a 3 mm nonobstructing right renal inferior pole calculus. The left kidney is unremarkable. The visualized ureters and urinary bladder appear unremarkable as well. Stomach/Bowel: Mildly thickened appearance of  the jejunal folds in the left hemiabdomen likely related to underdistention. No bowel obstruction or active inflammation. Normal appendix. Vascular/Lymphatic: The abdominal aorta and IVC are grossly unremarkable on this noncontrast CT. No portal venous gas. There is no adenopathy. Reproductive: The uterus is retroflexed. The ovaries are grossly unremarkable as visualized. Other: None Musculoskeletal: No acute or significant osseous findings. IMPRESSION: 1. Mildly thickened appearing gallbladder wall. Further evaluation with ultrasound is recommended. 2. No bowel obstruction or active inflammation. Normal appendix. 3. A 3 mm nonobstructing right renal inferior pole calculus. No hydronephrosis. Electronically Signed   By: Elgie Collard M.D.   On: 05/06/2018 00:40   US Abdomen Limited Ruq  Result Date: 05/06/2018 CLINICAL DATA:  RIGHT upper quadrant pain. Follow-up of normal gallbladder. EXAM: ULTRASOUND ABDOMEN LIMITED RIGHT UPPER QUADRANT COMPARISON:  CT abdomen and pelvis May 06, 2018 FINDINGS: Gallbladder: Gallbladder wall is thickened at 7 mm. Layering gallbladder sludge and trace pericholecystic fluid. No echogenic cholelithiasis. No sonographic Murphy sign elicited. Common bile duct: Diameter: 2 mm Liver: Echogenic 3 cm mass RIGHT lobe of the liver most compatible with hemangioma. Within normal limits in parenchymal echogenicity. Portal vein is patent on color Doppler imaging with normal direction of blood flow towards the liver. IMPRESSION: 1. Sonographic findings of acute cholecystitis without sonographic Murphy sign, however patient received pain medication. 2. Acute findings discussed with and reconfirmed by Dr.NEIL RIFENBARK on 05/06/2018 at 2:08 am. Electronically Signed   By: Awilda Metro M.D.   On: 05/06/2018 02:08    Assessment and Plan: This is a 32 y.o. female with acute cholecystitis.  Pain reviewed the patient's imaging studies and reviewed the patient's laboratory studies.   Overall patient does have gallbladder wall thickening both the CAT scan and on ultrasound with trace pericholecystic fluid and findings supporting acute cholecystitis.    Discussed with the patient that given that she has factor XII deficiency and there is other family history of bleeding or clotting disorders, we will get a hematology consult to make sure that we do not need any other precautions prior to surgery.  We will start her on IV antibiotics IV fluids to keep her hydrated and appropriate pain and nausea control.  Will be to go to the operating room tomorrow 10/27 for laparoscopic cholecystectomy.  We will also check her LFTs again given that there is mild elevation of her AST and ALT.  Discussed with the patient the risks of surgery particularly laparoscopic cholecystectomy including the risk of bleeding, infection, injury to surrounding structures, and the potential for an open procedure.  She is willing to proceed.  All of her questions have been answered.   Howie Ill, MD [facility] Pg:  [phone]

## 2018-05-06 NOTE — ED Notes (Signed)
Ann, RN super called for status of transport team

## 2018-05-06 NOTE — ED Provider Notes (Addendum)
The patient CT is back concerning for gallbladder wall thickening.  Her pain is improved after 5 mg of IV Haldol.  Right upper quadrant ultrasound is pending.   Merrily Brittle, MD 05/06/18 0059  Patient's ultrasound is back positive for acute cholecystitis.  I discussed with on-call general surgeon Dr. Rayna Sexton who has graciously agreed to admit the patient to his service.  Changing antibiotics from ceftriaxone to Cipro given her Augmentin allergy.   Merrily Brittle, MD 05/06/18 [phone]

## 2018-05-06 NOTE — ED Notes (Addendum)
Pt reports nausea, finished with contrast, CT called  Pt reports pain is now in back and "wrapping around"

## 2018-05-06 NOTE — ED Notes (Signed)
No techs available for transport, Lea, RN charge notified

## 2018-05-06 NOTE — ED Notes (Signed)
Patient transported to CT 

## 2018-05-07 ENCOUNTER — Encounter: Payer: Self-pay | Admitting: Anesthesiology

## 2018-05-07 ENCOUNTER — Observation Stay: Payer: BLUE CROSS/BLUE SHIELD | Admitting: Anesthesiology

## 2018-05-07 ENCOUNTER — Encounter
Admission: EM | Disposition: A | Discharge status: Home / Self Care | Payer: Self-pay | Source: Home / Self Care | Attending: Emergency Medicine

## 2018-05-07 DIAGNOSIS — K810 Acute cholecystitis: Secondary | ICD-10-CM | POA: Diagnosis not present

## 2018-05-07 HISTORY — PX: CHOLECYSTECTOMY: SHX55

## 2018-05-07 LAB — COMPREHENSIVE METABOLIC PANEL
ALBUMIN: 3.3 g/dL — AB (ref 3.5–5.0)
ALK PHOS: 49 U/L (ref 38–126)
ALT: 68 U/L — AB (ref 0–44)
AST: 29 U/L (ref 15–41)
Anion gap: 7 (ref 5–15)
BILIRUBIN TOTAL: 0.8 mg/dL (ref 0.3–1.2)
BUN: <5 mg/dL — ABNORMAL LOW (ref 6–20)
CALCIUM: 8.6 mg/dL — AB (ref 8.9–10.3)
CO2: 26 mmol/L (ref 22–32)
CREATININE: 0.52 mg/dL (ref 0.44–1.00)
Chloride: 106 mmol/L (ref 98–111)
GFR calc Af Amer: >60 mL/min (ref >60)
GFR calc non Af Amer: >60 mL/min (ref >60)
GLUCOSE: 98 mg/dL (ref 70–99)
Potassium: 3.8 mmol/L (ref 3.5–5.1)
SODIUM: 139 mmol/L (ref 135–145)
Total Protein: 5.7 g/dL — ABNORMAL LOW (ref 6.5–8.1)

## 2018-05-07 LAB — CBC WITH DIFFERENTIAL/PLATELET
ABS IMMATURE GRANULOCYTES: 0.04 10*3/uL (ref 0.00–0.07)
BASOS ABS: 0 10*3/uL (ref 0.0–0.1)
Basophils Relative: 0 %
Eosinophils Absolute: 0.1 10*3/uL (ref 0.0–0.5)
Eosinophils Relative: 1 %
HEMATOCRIT: 34.9 % — AB (ref 36.0–46.0)
HEMOGLOBIN: 11.7 g/dL — AB (ref 12.0–15.0)
Immature Granulocytes: 1 %
LYMPHS ABS: 2.1 10*3/uL (ref 0.7–4.0)
LYMPHS PCT: 27 %
MCH: 31.5 pg (ref 26.0–34.0)
MCHC: 33.5 g/dL (ref 30.0–36.0)
MCV: 93.8 fL (ref 80.0–100.0)
Monocytes Absolute: 0.7 10*3/uL (ref 0.1–1.0)
Monocytes Relative: 9 %
NEUTROS ABS: 5 10*3/uL (ref 1.7–7.7)
Neutrophils Relative %: 62 %
Platelets: 202 10*3/uL (ref 150–400)
RBC: 3.72 MIL/uL — AB (ref 3.87–5.11)
RDW: 12.4 % (ref 11.5–15.5)
WBC: 7.9 10*3/uL (ref 4.0–10.5)
nRBC: 0 % (ref 0.0–0.2)

## 2018-05-07 LAB — PREGNANCY, URINE: PREG TEST UR: NEGATIVE

## 2018-05-07 SURGERY — LAPAROSCOPIC CHOLECYSTECTOMY
Anesthesia: General

## 2018-05-07 MED ORDER — OXYCODONE HCL 5 MG PO TABS
5.0000 mg | ORAL_TABLET | ORAL | Status: DC | PRN
  Administered 2018-05-07 – 2018-05-08 (×3): 5 mg via ORAL
  Filled 2018-05-07 (×3): qty 1

## 2018-05-07 MED ORDER — ONDANSETRON HCL 4 MG/2ML IJ SOLN
INTRAMUSCULAR | Status: DC | PRN
  Administered 2018-05-07: 4 mg via INTRAVENOUS

## 2018-05-07 MED ORDER — PHENYLEPHRINE HCL 10 MG/ML IJ SOLN
INTRAMUSCULAR | Status: DC | PRN
  Administered 2018-05-07 (×5): 50 ug via INTRAVENOUS

## 2018-05-07 MED ORDER — LIDOCAINE HCL (CARDIAC) PF 100 MG/5ML IV SOSY
PREFILLED_SYRINGE | INTRAVENOUS | Status: DC | PRN
  Administered 2018-05-07: 50 mg via INTRAVENOUS

## 2018-05-07 MED ORDER — ACETAMINOPHEN 10 MG/ML IV SOLN
INTRAVENOUS | Status: AC
  Filled 2018-05-07: qty 100

## 2018-05-07 MED ORDER — DEXAMETHASONE SODIUM PHOSPHATE 10 MG/ML IJ SOLN
INTRAMUSCULAR | Status: AC
  Filled 2018-05-07: qty 1

## 2018-05-07 MED ORDER — ACETAMINOPHEN 500 MG PO TABS: 1000.0000 mg | ORAL_TABLET | Freq: Four times a day (QID) | ORAL | Status: DC | PRN

## 2018-05-07 MED ORDER — MIDAZOLAM HCL 2 MG/2ML IJ SOLN
INTRAMUSCULAR | Status: DC | PRN
  Administered 2018-05-07: 2 mg via INTRAVENOUS

## 2018-05-07 MED ORDER — SUCCINYLCHOLINE CHLORIDE 20 MG/ML IJ SOLN
INTRAMUSCULAR | Status: DC | PRN
  Administered 2018-05-07: 60 mg via INTRAVENOUS

## 2018-05-07 MED ORDER — PHENYLEPHRINE HCL 10 MG/ML IJ SOLN
INTRAMUSCULAR | Status: AC
  Filled 2018-05-07: qty 1

## 2018-05-07 MED ORDER — SUGAMMADEX SODIUM 200 MG/2ML IV SOLN
INTRAVENOUS | Status: AC
  Filled 2018-05-07: qty 2

## 2018-05-07 MED ORDER — BUPIVACAINE-EPINEPHRINE (PF) 0.25% -1:200000 IJ SOLN
INTRAMUSCULAR | Status: DC | PRN
  Administered 2018-05-07: 30 mL

## 2018-05-07 MED ORDER — FAMOTIDINE IN NACL 20-0.9 MG/50ML-% IV SOLN
20.0000 mg | Freq: Two times a day (BID) | INTRAVENOUS | Status: DC
  Administered 2018-05-07 (×3): 20 mg via INTRAVENOUS
  Filled 2018-05-07 (×3): qty 50

## 2018-05-07 MED ORDER — PROMETHAZINE HCL 25 MG/ML IJ SOLN: 6.2500 mg | INTRAMUSCULAR | Status: DC | PRN

## 2018-05-07 MED ORDER — ONDANSETRON HCL 4 MG/2ML IJ SOLN
INTRAMUSCULAR | Status: AC
  Filled 2018-05-07: qty 2

## 2018-05-07 MED ORDER — ROCURONIUM BROMIDE 100 MG/10ML IV SOLN
INTRAVENOUS | Status: DC | PRN
  Administered 2018-05-07: 30 mg via INTRAVENOUS
  Administered 2018-05-07: 5 mg via INTRAVENOUS

## 2018-05-07 MED ORDER — MIDAZOLAM HCL 2 MG/2ML IJ SOLN
INTRAMUSCULAR | Status: AC
  Filled 2018-05-07: qty 2

## 2018-05-07 MED ORDER — SUGAMMADEX SODIUM 200 MG/2ML IV SOLN
INTRAVENOUS | Status: DC | PRN
  Administered 2018-05-07: 105 mg via INTRAVENOUS

## 2018-05-07 MED ORDER — ONDANSETRON HCL 4 MG/2ML IJ SOLN
INTRAMUSCULAR | Status: AC
  Administered 2018-05-07: 20:00:00
  Filled 2018-05-07: qty 2

## 2018-05-07 MED ORDER — FENTANYL CITRATE (PF) 100 MCG/2ML IJ SOLN
INTRAMUSCULAR | Status: AC
  Filled 2018-05-07: qty 2

## 2018-05-07 MED ORDER — FENTANYL CITRATE (PF) 100 MCG/2ML IJ SOLN
INTRAMUSCULAR | Status: DC | PRN
  Administered 2018-05-07: 75 ug via INTRAVENOUS
  Administered 2018-05-07 (×2): 25 ug via INTRAVENOUS

## 2018-05-07 MED ORDER — ROCURONIUM BROMIDE 50 MG/5ML IV SOLN
INTRAVENOUS | Status: AC
  Filled 2018-05-07: qty 1

## 2018-05-07 MED ORDER — DEXAMETHASONE SODIUM PHOSPHATE 10 MG/ML IJ SOLN
INTRAMUSCULAR | Status: DC | PRN
  Administered 2018-05-07: 8 mg via INTRAVENOUS

## 2018-05-07 MED ORDER — BUPIVACAINE-EPINEPHRINE (PF) 0.25% -1:200000 IJ SOLN
INTRAMUSCULAR | Status: AC
  Filled 2018-05-07: qty 30

## 2018-05-07 MED ORDER — PROPOFOL 10 MG/ML IV BOLUS
INTRAVENOUS | Status: AC
  Filled 2018-05-07: qty 20

## 2018-05-07 MED ORDER — LIDOCAINE HCL (PF) 2 % IJ SOLN
INTRAMUSCULAR | Status: AC
  Filled 2018-05-07: qty 10

## 2018-05-07 MED ORDER — FENTANYL CITRATE (PF) 100 MCG/2ML IJ SOLN: 25.0000 ug | INTRAMUSCULAR | Status: DC | PRN

## 2018-05-07 MED ORDER — ACETAMINOPHEN 10 MG/ML IV SOLN
INTRAVENOUS | Status: DC | PRN
  Administered 2018-05-07: 20 mg via INTRAVENOUS

## 2018-05-07 MED ORDER — PROPOFOL 10 MG/ML IV BOLUS
INTRAVENOUS | Status: DC | PRN
  Administered 2018-05-07: 100 mg via INTRAVENOUS

## 2018-05-07 SURGICAL SUPPLY — 43 items
APPLIER CLIP 5 13 M/L LIGAMAX5 (MISCELLANEOUS) ×3
BLADE SURG 15 STRL LF DISP TIS (BLADE) ×1 IMPLANT
BLADE SURG 15 STRL SS (BLADE) ×2
CANISTER SUCT 1200ML W/VALVE (MISCELLANEOUS) ×3 IMPLANT
CATH CHOLANGI 4FR 420404F (CATHETERS) IMPLANT
CHLORAPREP W/TINT 26ML (MISCELLANEOUS) ×3 IMPLANT
CLIP APPLIE 5 13 M/L LIGAMAX5 (MISCELLANEOUS) ×1 IMPLANT
CONRAY 60ML FOR OR (MISCELLANEOUS) IMPLANT
COVER WAND RF STERILE (DRAPES) IMPLANT
DERMABOND ADVANCED (GAUZE/BANDAGES/DRESSINGS) ×2
DERMABOND ADVANCED .7 DNX12 (GAUZE/BANDAGES/DRESSINGS) ×1 IMPLANT
DRAPE C-ARM XRAY 36X54 (DRAPES) IMPLANT
ELECT REM PT RETURN 9FT ADLT (ELECTROSURGICAL) ×3
ELECTRODE REM PT RTRN 9FT ADLT (ELECTROSURGICAL) ×1 IMPLANT
FILTER LAP SMOKE EVAC STRL (MISCELLANEOUS) ×3 IMPLANT
GLOVE SURG SYN 7.0 (GLOVE) ×6 IMPLANT
GLOVE SURG SYN 7.5  E (GLOVE) ×4
GLOVE SURG SYN 7.5 E (GLOVE) ×2 IMPLANT
GOWN STRL REUS W/ TWL LRG LVL3 (GOWN DISPOSABLE) ×2 IMPLANT
GOWN STRL REUS W/TWL LRG LVL3 (GOWN DISPOSABLE) ×4
IRRIGATION STRYKERFLOW (MISCELLANEOUS) ×1 IMPLANT
IRRIGATOR STRYKERFLOW (MISCELLANEOUS) ×3
IV CATH ANGIO 12GX3 LT BLUE (NEEDLE) IMPLANT
IV NS 1000ML (IV SOLUTION) ×2
IV NS 1000ML BAXH (IV SOLUTION) ×1 IMPLANT
JACKSON PRATT 10 (INSTRUMENTS) IMPLANT
L-HOOK LAP DISP 36CM (ELECTROSURGICAL) ×3
LABEL OR SOLS (LABEL) ×3 IMPLANT
LHOOK LAP DISP 36CM (ELECTROSURGICAL) ×1 IMPLANT
NEEDLE HYPO 22GX1.5 SAFETY (NEEDLE) ×3 IMPLANT
PACK LAP CHOLECYSTECTOMY (MISCELLANEOUS) ×3 IMPLANT
PENCIL ELECTRO HAND CTR (MISCELLANEOUS) ×3 IMPLANT
POUCH SPECIMEN RETRIEVAL 10MM (ENDOMECHANICALS) ×3 IMPLANT
SCISSORS METZENBAUM CVD 33 (INSTRUMENTS) ×3 IMPLANT
SLEEVE ADV FIXATION 5X100MM (TROCAR) ×9 IMPLANT
SPONGE VERSALON 4X4 4PLY (MISCELLANEOUS) IMPLANT
SUT MNCRL 4-0 (SUTURE) ×2
SUT MNCRL 4-0 27XMFL (SUTURE) ×1
SUT VICRYL 0 AB UR-6 (SUTURE) ×3 IMPLANT
SUTURE MNCRL 4-0 27XMF (SUTURE) ×1 IMPLANT
TROCAR BALLN GELPORT 12X130M (ENDOMECHANICALS) ×3 IMPLANT
TROCAR Z-THREAD OPTICAL 5X100M (TROCAR) ×3 IMPLANT
TUBING INSUFFLATION (TUBING) ×3 IMPLANT

## 2018-05-07 NOTE — Anesthesia Post-op Follow-up Note (Signed)
Anesthesia QCDR form completed.        

## 2018-05-07 NOTE — Anesthesia Procedure Notes (Signed)
Procedure Name: Intubation Performed by: Adriella Essex, CRNA Pre-anesthesia Checklist: Patient identified, Patient being monitored, Timeout performed, Emergency Drugs available and Suction available Patient Re-evaluated:Patient Re-evaluated prior to induction Oxygen Delivery Method: Circle system utilized Preoxygenation: Pre-oxygenation with 100% oxygen Induction Type: IV induction Ventilation: Mask ventilation without difficulty Laryngoscope Size: Mac and 3 Grade View: Grade I Tube type: Oral Tube size: 7.0 mm Number of attempts: 1 Airway Equipment and Method: Stylet Placement Confirmation: ETT inserted through vocal cords under direct vision,  positive ETCO2 and breath sounds checked- equal and bilateral Secured at: 21 cm Tube secured with: Tape Dental Injury: Teeth and Oropharynx as per pre-operative assessment        

## 2018-05-07 NOTE — Progress Notes (Signed)
15  Minute call to floor. 

## 2018-05-07 NOTE — Progress Notes (Addendum)
05/07/2018  Subjective: No acute events.  Patient ready for surgery.  Appreciate Dr. Sharlette Dense input regarding her Factor XII deficiency.  Vital signs: Temp:  [97.6 F (36.4 C)-98.4 F (36.9 C)] 97.6 F (36.4 C) (10/27 1311) Pulse Rate:  [81-113] 99 (10/27 1138) Resp:  [14-18] 16 (10/27 1311) BP: (91-111)/(60-72) 99/64 (10/27 1311) SpO2:  [97 %-100 %] 99 % (10/27 1311)   Intake/Output: 10/26 0701 - 10/27 0700 In: 3574.2 [I.V.:1925.5; IV Piggyback:1648.7] Out: 2700 [Urine:2700] Last BM Date: 05/05/18  Physical Exam: Constitutional: No acute distress Abdomen: soft, nondistended, mildly tender in RUQ.  Labs:  Recent Labs    05/06/18 0547 05/07/18 0339  WBC 13.1* 7.9  HGB 11.9* 11.7*  HCT 36.3 34.9*  PLT 230 202   Recent Labs    05/06/18 0547 05/07/18 0339  NA 141 139  K 4.2 3.8  CL 110 106  CO2 24 26  GLUCOSE 91 98  BUN 6 <5*  CREATININE 0.56 0.52  CALCIUM 7.9* 8.6*   Recent Labs    05/06/18 0547  LABPROT 12.6  INR 0.95    Imaging: No results found.  Assessment/Plan: This is a 32 y.o. female with acute cholecystitis  --to OR today for lap chole. --likely d/c home tomorrow.   Howie Ill, MD [facility]

## 2018-05-07 NOTE — Transfer of Care (Signed)
Immediate Anesthesia Transfer of Care Note  Patient: Catherine Mendez  Procedure(s) Performed: LAPAROSCOPIC CHOLECYSTECTOMY (N/A )  Patient Location: PACU  Anesthesia Type:General  Level of Consciousness: drowsy and responds to stimulation  Airway & Oxygen Therapy: Patient Spontanous Breathing and Patient connected to face mask oxygen  Post-op Assessment: Report given to RN and Post -op Vital signs reviewed and stable  Post vital signs: Reviewed and stable  Last Vitals:  Vitals Value Taken Time  BP 105/68 05/07/2018  9:56 AM  Temp    Pulse 113 05/07/2018  9:56 AM  Resp 16 05/07/2018  9:56 AM  SpO2 100 % 05/07/2018  9:56 AM  Vitals shown include unvalidated device data.  Last Pain:  Vitals:   05/07/18 0443  TempSrc: Oral  PainSc:          Complications: No apparent anesthesia complications

## 2018-05-07 NOTE — Op Note (Signed)
  Procedure Date:  05/07/2018  Pre-operative Diagnosis:  Acute cholecystitis  Post-operative Diagnosis:  Acute cholecystitis  Procedure:  Laparoscopic cholecystectomy  Surgeon:  Howie Ill, MD  Anesthesia:  General endotracheal  Estimated Blood Loss:  10 ml  Specimens:  gallbladder  Complications:  None  Indications for Procedure:  This is a 32 y.o. female who presents with abdominal pain and workup revealing acute cholecystitis.  The benefits, complications, treatment options, and expected outcomes were discussed with the patient. The risks of bleeding, infection, recurrence of symptoms, failure to resolve symptoms, bile duct damage, bile duct leak, retained common bile duct stone, bowel injury, and need for further procedures were all discussed with the patient and she was willing to proceed.  Description of Procedure: The patient was correctly identified in the preoperative area and brought into the operating room.  The patient was placed supine with VTE prophylaxis in place.  Appropriate time-outs were performed.  Anesthesia was induced and the patient was intubated.  Appropriate antibiotics were infused.  The abdomen was prepped and draped in a sterile fashion. An infraumbilical incision was made. A cutdown technique was used to enter the abdominal cavity without injury, and a Hasson trocar was inserted.  Pneumoperitoneum was obtained with appropriate opening pressures.  A 5-mm port was placed in the subxiphoid area and two 5-mm ports were placed in the right upper quadrant under direct visualization.  The gallbladder was identified.  The fundus was grasped and retracted cephalad.  Adhesions were lysed bluntly and with electrocautery. The infundibulum was grasped and retracted laterally, exposing the peritoneum overlying the gallbladder.  This was incised with electrocautery and extended on either side of the gallbladder.  The cystic duct and cystic artery were clearly identified  and bluntly dissected.  Both were clipped twice proximally and once distally, cutting in between.  The gallbladder was taken from the gallbladder fossa in a retrograde fashion with electrocautery. The gallbladder was placed in an Endocatch bag. The liver bed was inspected and any bleeding was controlled with electrocautery. The right upper quadrant was then inspected again revealing intact clips, no bleeding, and no ductal injury.  The area was thoroughly irrigated.  The 5 mm ports were removed under direct visualization and the Hasson trocar was removed.  The Endocatch bag was brought out via the umbilical incision. The fascial opening was closed using 0 vicryl suture.  Local anesthetic was infused in all incisions and the incisions were closed with 4-0 Monocryl.  The wounds were cleaned and sealed with DermaBond.  The patient was emerged from anesthesia and extubated and brought to the recovery room for further management.  The patient tolerated the procedure well and all counts were correct at the end of the case.   Howie Ill, MD

## 2018-05-07 NOTE — Anesthesia Postprocedure Evaluation (Signed)
Anesthesia Post Note  Patient: ROCQUEL ASKREN  Procedure(s) Performed: LAPAROSCOPIC CHOLECYSTECTOMY (N/A )  Patient location during evaluation: PACU Anesthesia Type: General Level of consciousness: awake and alert Pain management: pain level controlled Vital Signs Assessment: post-procedure vital signs reviewed and stable Respiratory status: spontaneous breathing, nonlabored ventilation, respiratory function stable and patient connected to nasal cannula oxygen Cardiovascular status: blood pressure returned to baseline and stable Postop Assessment: no apparent nausea or vomiting Anesthetic complications: no     Last Vitals:  Vitals:   05/07/18 1311 05/07/18 2040  BP: 99/64 102/68  Pulse:  (!) 105  Resp: 16 20  Temp: 36.4 C 36.8 C  SpO2: 99% 99%    Last Pain:  Vitals:   05/07/18 2045  TempSrc:   PainSc: 4                  Lenard Simmer

## 2018-05-08 ENCOUNTER — Encounter: Payer: Self-pay | Admitting: Surgery

## 2018-05-08 MED ORDER — METRONIDAZOLE 500 MG PO TABS: 500.0000 mg | ORAL_TABLET | Freq: Three times a day (TID) | ORAL | 0 refills | Status: AC

## 2018-05-08 MED ORDER — CIPROFLOXACIN HCL 500 MG PO TABS: 500.0000 mg | ORAL_TABLET | Freq: Two times a day (BID) | ORAL | 0 refills | Status: AC

## 2018-05-08 MED ORDER — IBUPROFEN 600 MG PO TABS: 600.0000 mg | ORAL_TABLET | Freq: Three times a day (TID) | ORAL | 0 refills | Status: AC | PRN

## 2018-05-08 MED ORDER — OXYCODONE HCL 5 MG PO TABS: 5.0000 mg | ORAL_TABLET | Freq: Four times a day (QID) | ORAL | 0 refills | Status: DC | PRN

## 2018-05-08 NOTE — Discharge Summary (Signed)
Patient ID: Catherine Mendez MRN: [id number] DOB/AGE: 12/25/1985 32 y.o.  Admit date: 05/05/2018 Discharge date: 05/08/2018   Discharge Diagnoses:  Active Problems:   Acute cholecystitis   Procedures:  Laparoscopic cholecystectomy  Hospital Course:  Patient was admitted on 10/25 with acute cholecystitis.  She had a history of Factor XII deficiency and Hematology was consulted for further recommendations for surgery.  She was taken to the OR on 10/27 and underwent the above procedure.  She tolerated the procedure well.  Her diet was slowly advanced, her pain was well controlled, and she was ambulating and voiding without complications.  On exam, she was in no acute distress with stable vital signs.  Her abdomen was soft, non-distended, appropriately tender to palpation.  Incisions were clean, dry, and intact.  Consults:  Hematology/Oncology.  Disposition: Home, self-care  Discharge Instructions    Call MD for:  difficulty breathing, headache or visual disturbances   Complete by:  As directed    Call MD for:  persistant nausea and vomiting   Complete by:  As directed    Call MD for:  redness, tenderness, or signs of infection (pain, swelling, redness, odor or green/yellow discharge around incision site)   Complete by:  As directed    Call MD for:  severe uncontrolled pain   Complete by:  As directed    Call MD for:  temperature >100.4   Complete by:  As directed    Diet - low sodium heart healthy   Complete by:  As directed    Discharge instructions   Complete by:  As directed    1.  Patient may shower, but do not scrub wounds heavily and dab dry only. 2.  Do not submerge wounds in pool/tub for 1 week. 3.  Do not apply ointments or hydrogen peroxide to the wounds.   Driving Restrictions   Complete by:  As directed    Do not drive while taking narcotics for pain control.   Increase activity slowly   Complete by:  As directed    Lifting restrictions   Complete by:  As  directed    No heavy lifting or pushing of more than 10-15 lbs for 4 weeks.   No dressing needed   Complete by:  As directed      Allergies as of 05/08/2018      Reactions   Augmentin [amoxicillin-pot Clavulanate]    diarhea   Iodinated Diagnostic Agents Rash   Shellfish-derived Products Rash      Medication List    TAKE these medications   acetaminophen 500 MG tablet Commonly known as:  TYLENOL Take 500 mg by mouth every 6 (six) hours as needed.   ciprofloxacin 500 MG tablet Commonly known as:  CIPRO Take 1 tablet (500 mg total) by mouth 2 (two) times daily for 7 days.   DYMISTA 137-50 MCG/ACT Susp Generic drug:  Azelastine-Fluticasone Place 2 sprays into both nostrils 2 (two) times daily.   fludrocortisone 0.1 MG tablet Commonly known as:  FLORINEF Take 1 tablet (0.1 mg total) by mouth 2 (two) times daily.   ibuprofen 600 MG tablet Commonly known as:  ADVIL,MOTRIN Take 1 tablet (600 mg total) by mouth every 8 (eight) hours as needed for fever or mild pain.   metroNIDAZOLE 500 MG tablet Commonly known as:  FLAGYL Take 1 tablet (500 mg total) by mouth 3 (three) times daily for 7 days.   midodrine 5 MG tablet Commonly known as:  PROAMATINE Take 1 tablet (  5 mg) by mouth four times a day   oxyCODONE 5 MG immediate release tablet Commonly known as:  Oxy IR/ROXICODONE Take 1-2 tablets (5-10 mg total) by mouth every 6 (six) hours as needed for severe pain.   PROBIOTIC ADVANCED PO Take by mouth.   SUMAtriptan 100 MG tablet Commonly known as:  IMITREX Take 100 mg by mouth every 2 (two) hours as needed.      Follow-up Information    Masaji Billups, Elita Quick, MD Follow up in 2 week(s).   Specialty:  General Surgery Contact information: [address] [place] [address] [phone]

## 2018-05-09 LAB — SURGICAL PATHOLOGY

## 2018-05-24 ENCOUNTER — Ambulatory Visit (INDEPENDENT_AMBULATORY_CARE_PROVIDER_SITE_OTHER): Payer: BLUE CROSS/BLUE SHIELD | Admitting: Surgery

## 2018-05-24 ENCOUNTER — Other Ambulatory Visit: Payer: Self-pay

## 2018-05-24 ENCOUNTER — Encounter: Payer: Self-pay | Admitting: Surgery

## 2018-05-24 VITALS — BP 102/68 | HR 101 | Temp 97.5°F | Resp 16 | Ht 59.0 in | Wt 109.6 lb

## 2018-05-24 DIAGNOSIS — Z09 Encounter for follow-up examination after completed treatment for conditions other than malignant neoplasm: Secondary | ICD-10-CM

## 2018-05-24 DIAGNOSIS — K810 Acute cholecystitis: Secondary | ICD-10-CM

## 2018-05-24 NOTE — Progress Notes (Signed)
05/24/2018  HPI: Kathryne Erikssoniffany M Sharpnack is a 32 y.o. female s/p laparoscopic cholecystectomy on 10/27 for acute cholecystitis.  She presents for follow up.  She reports doing well, with only some mild discomfort depending on body movement.  Tolerating diet, with some crampiness with greasy food, but otherwise no diarrhea, constipation, nausea.    Vital signs: BP 102/68   Pulse (!) 101   Temp (!) 97.5 F (36.4 C) (Temporal)   Resp 16   Ht 4\' 11"  (1.499 m)   Wt 109 lb 9.6 oz (49.7 kg)   LMP 05/11/2018   SpO2 98%   BMI 22.14 kg/m    Physical Exam: Constitutional: No acute distress Abdomen:  Soft, nondistended, nontender to palpation.  Incisions healing well, clean, dry, intact.  Assessment/Plan: This is a 32 y.o. female s/p laparoscopic cholecystectomy.  --Reviewed pathology with patient.  Acute on chronic cholecystitis, one benign cholesterol polyp, and negative for malignancy. --Reminded patient of no heavy lifting or pushing of no more than 10-15 lbs for 2 more weeks.  Then may resume normal activities. --May follow up PRN.   Howie IllJose Luis Jessia Kief, MD [facility]

## 2018-05-24 NOTE — Patient Instructions (Signed)

## 2018-06-05 ENCOUNTER — Other Ambulatory Visit: Payer: Self-pay | Admitting: Medical

## 2018-06-05 ENCOUNTER — Telehealth: Payer: Self-pay

## 2018-06-05 DIAGNOSIS — Z0000 Encounter for general adult medical examination without abnormal findings: Secondary | ICD-10-CM

## 2018-06-05 NOTE — Telephone Encounter (Signed)
Pt called to request lab work prior to her last day of employment on 06/10/18; verified with HR that actual last date of employment was 05/19/18; clinic services are terminated on the last day of employment with [employer] therefore lab work cannot be performed; pt verb u/o and states she was so appreciative of the care she received in the clinic while she worked here

## 2018-06-05 NOTE — Progress Notes (Signed)
Review of labs since hospitalization for gallbladder removal.

## 2018-06-05 NOTE — Telephone Encounter (Signed)
Pt called to request lab work prior to her last day of employment on 06/10/18; verified with HR  last date of employment was actually 05/19/18 and services are terminated at that same time so cannot perform lab work; called pt to inform and she was

## 2018-06-14 ENCOUNTER — Ambulatory Visit: Payer: 59 | Admitting: Family Medicine

## 2018-06-14 ENCOUNTER — Encounter: Payer: Self-pay | Admitting: Family Medicine

## 2018-06-14 VITALS — BP 88/62 | HR 90 | Temp 98.0°F | Ht 59.0 in | Wt 112.0 lb

## 2018-06-14 DIAGNOSIS — F411 Generalized anxiety disorder: Secondary | ICD-10-CM

## 2018-06-14 DIAGNOSIS — G43109 Migraine with aura, not intractable, without status migrainosus: Secondary | ICD-10-CM

## 2018-06-14 DIAGNOSIS — R000 Tachycardia, unspecified: Secondary | ICD-10-CM | POA: Diagnosis not present

## 2018-06-14 DIAGNOSIS — I951 Orthostatic hypotension: Secondary | ICD-10-CM

## 2018-06-14 DIAGNOSIS — R945 Abnormal results of liver function studies: Secondary | ICD-10-CM | POA: Diagnosis not present

## 2018-06-14 DIAGNOSIS — R7989 Other specified abnormal findings of blood chemistry: Secondary | ICD-10-CM

## 2018-06-14 DIAGNOSIS — D682 Hereditary deficiency of other clotting factors: Secondary | ICD-10-CM

## 2018-06-14 DIAGNOSIS — Z862 Personal history of diseases of the blood and blood-forming organs and certain disorders involving the immune mechanism: Secondary | ICD-10-CM

## 2018-06-14 DIAGNOSIS — G90A Postural orthostatic tachycardia syndrome (POTS): Secondary | ICD-10-CM

## 2018-06-14 DIAGNOSIS — Z8632 Personal history of gestational diabetes: Secondary | ICD-10-CM

## 2018-06-14 NOTE — Assessment & Plan Note (Signed)
Chronic problem Fairly well controlled and infrequent No need for migraine prophylaxis Can continue to use triptans as needed Would avoid estrogen-containing OCPs given that she does have migraine with aura

## 2018-06-14 NOTE — Progress Notes (Signed)
Patient: Catherine Mendez, Female    DOB: 05/29/1986, 32 y.o.   MRN: [id number] Visit Date: 06/14/2018  Today's Provider: Shirlee Latch, MD   Chief Complaint  Patient presents with  . Establish Care   Subjective:  I, Presley Raddle, CMA, am acting as a scribe for Shirlee Latch, MD.    New Patient:  Catherine Mendez is a 32 y.o. female who presents today to Establish Care. Patient was seeing Ellie Lunch PA-C at [facility]. She is no longer employed there.  She feels well. She reports exercising 2 days per week. She reports she is sleeping fairly well.  She had suffered with GAD and MDD in the past.  She took medications throughout college, but then as stress levels improved, her mood was better.  She did need a low dose of Zoloft last winter for SAD.  She has noticed a small dip in mood as the seasons changed this year, but not enough to consider taking a medication.  She has a factor VII deficiency, which physiologically does not cause a clotting disorder, but PTT is lengthened.  There are several clotting disorders in her family.  She has been worked up by Hematology and genetics.  She has no h/o VTE.  Per hematology, she does not need anticoagulation, even when having surgery.  POTS: Treated by Dr Graciela Husbands (Cardiology).  Not on meds currently.  Worse in the summer when it is hot or when exercising or menstruating.  She will take some combination of midodrine, florinef, electrolyte supplementation.  She also wears thigh-high compression leggings.  Allergic rhinitis: Long-standing issue.  Worse in the fall and winter as it is likely ragweed allergy.  This last year, she suffered with recurrent sinusitis.  She was seen by [facility].  She was treated multiple times with antibiotics.  She was told that after the last episode if this continues to recur, she may need sinus surgery.  She has not had any travel within the last few months as it got colder outside.  She is  using Dymista daily which seems to help some  Patient has a history of anemia, gestational diabetes, and cholestasis of pregnancy.  She had elevated liver enzymes related to acute cholecystitis in 04/2018.  She is status post cholecystectomy.  She is done well after her surgery.  She only had one episode of diarrhea after a high-fat meal.  Migraines: These are infrequent now that she is off her OCPs, which had been triggering her migraines.  She gets less than 1/month.  She uses a triptan only as needed, which only seems to be about once per year.  She does have aura.  She will take Tylenol or ibuprofen when they seem mild.  -----------------------------------------------------------------   Review of Systems  Constitutional: Negative.   HENT: Negative.   Eyes: Negative.   Respiratory: Negative.   Cardiovascular: Negative.   Gastrointestinal: Negative.   Endocrine: Negative.   Genitourinary: Negative.   Musculoskeletal: Negative.   Skin: Negative.   Allergic/Immunologic: Positive for environmental allergies.  Neurological: Negative.   Hematological: Negative.   Psychiatric/Behavioral: Negative.     Social History      She  reports that she has never smoked. She has never used smokeless tobacco. She reports that she drinks alcohol. She reports that she does not use drugs.       Social History   Socioeconomic History  . Marital status: Married    Spouse name: Not on  file  . Number of children: 1  . Years of education: Not on file  . Highest education level: Not on file  Occupational History  . 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 Financial resource strain: Not on file  . Food insecurity:    Worry: Not on file    Inability: Not on file  . Transportation needs:    Medical: Not on file    Non-medical: Not on file  Tobacco Use  . Smoking status: Never Smoker  . Smokeless tobacco: Never Used  Substance and Sexual Activity  . Alcohol use: Yes     Comment: wine occassionally  . Drug use: No  . Sexual activity: Yes    Partners: Male    Birth control/protection: Condom  Lifestyle  . Physical activity:    Days per week: Not on file    Minutes per session: Not on file  . Stress: Not on file  Relationships  . Social connections:    Talks on phone: Not on file    Gets together: Not on file    Attends religious service: Not on file    Active member of club or organization: Not on file    Attends meetings of clubs or organizations: Not on file    Relationship status: Not on file  Other Topics Concern  . Not on file  Social History Narrative   ** Merged History Encounter **        Past Medical History:  Diagnosis Date  . Anemia    in past   . Anxiety    medicine in college for deopression /anxiety   . Blood transfusion without reported diagnosis    2 with childbirth/ choleithias with pregnancy  . Clotting disorder (HCC)   . Elevated liver function tests 06/10/2015  . Factor XII deficiency (HCC)   . Gestational diabetes 2016   gestational  . POTS (postural orthostatic tachycardia syndrome)      Patient Active Problem List   Diagnosis Date Noted  . Menorrhagia with regular cycle 01/02/2018  . Migraine with aura 06/26/2017  . Generalized anxiety disorder 06/09/2017  . 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 POTS (postural orthostatic tachycardia syndrome) 11/05/2016  . Elevated liver function tests 06/10/2015    Past Surgical History:  Procedure Laterality Date  . CHOLECYSTECTOMY N/A 05/07/2018   Procedure: LAPAROSCOPIC CHOLECYSTECTOMY;  Surgeon: Henrene DodgePiscoya, Jose, MD;  Location: [facility] ORS;  Service: General;  Laterality: N/A;  . COSMETIC SURGERY     dental implant   . MOUTH SURGERY  2013   bottom dental implant     Family History        Family Status  Relation Name Status  . Mother  Alive  . Father  Alive  . Sister  Alive  . Mat Aunt  (Not Specified)  . MGM  Alive  . MGF  Deceased  . PGM  Deceased    . PGF  Deceased  . Daughter  Alive        Her family history includes Aneurysm in her maternal aunt; Bleeding Disorder in her mother and sister; Cirrhosis in her paternal grandmother; Clotting disorder in her mother; Colon cancer (age of onset: 660) in her maternal grandfather and maternal grandmother; Fibromyalgia in her mother; Healthy in her daughter; Heart disease (age of onset: 2770) in her paternal grandfather; Hypertension in her father; Lung cancer in her maternal grandfather.      Allergies  Allergen Reactions  .  Augmentin [Amoxicillin-Pot Clavulanate]     diarhea  . Iodinated Diagnostic Agents Rash  . Shellfish-Derived Products Rash     Current Outpatient Medications:  .  acetaminophen (TYLENOL) 500 MG tablet, Take 500 mg by mouth every 6 (six) hours as needed., Disp: , Rfl:  .  DYMISTA 137-50 MCG/ACT SUSP, Place 2 sprays into both nostrils 2 (two) times daily., Disp: , Rfl: 12 .  ibuprofen (ADVIL,MOTRIN) 600 MG tablet, Take 1 tablet (600 mg total) by mouth every 8 (eight) hours as needed for fever or mild pain., Disp: 30 tablet, Rfl: 0 .  midodrine (PROAMATINE) 5 MG tablet, Take 1 tablet (5 mg) by mouth four times a day, Disp: , Rfl:  .  Probiotic Product (PROBIOTIC ADVANCED PO), Take by mouth., Disp: , Rfl:  .  SUMAtriptan (IMITREX) 100 MG tablet, Take 100 mg by mouth every 2 (two) hours as needed. , Disp: , Rfl:    Patient Care Team: Erasmo Downer, MD as PCP - General (Family Medicine)      Objective:   Vitals: BP (!) 88/62 (BP Location: Right Arm, Patient Position: Sitting, Cuff Size: Normal)   Pulse 90   Temp 98 F (36.7 C) (Oral)   Ht 4\' 11"  (1.499 m)   Wt 112 lb (50.8 kg)   SpO2 97%   BMI 22.62 kg/m    Vitals:   06/14/18 1021  BP: (!) 88/62  Pulse: 90  Temp: 98 F (36.7 C)  TempSrc: Oral  SpO2: 97%  Weight: 112 lb (50.8 kg)  Height: 4\' 11"  (1.499 m)     Physical Exam  Constitutional: She is oriented to person, place, and time. She appears  well-developed and well-nourished. No distress.  HENT:  Head: Normocephalic and atraumatic.  Right Ear: External ear normal.  Left Ear: External ear normal.  Nose: Nose normal.  Mouth/Throat: Oropharynx is clear and moist.  Eyes: Pupils are equal, round, and reactive to light. Conjunctivae and EOM are normal. No scleral icterus.  Neck: Neck supple. No thyromegaly present.  Cardiovascular: Normal rate, regular rhythm, normal heart sounds and intact distal pulses.  No murmur heard. Pulmonary/Chest: Effort normal and breath sounds normal. No respiratory distress. She has no wheezes. She has no rales.  Abdominal: Soft. Bowel sounds are normal. She exhibits no distension. There is no tenderness. There is no rebound and no guarding.  Musculoskeletal: She exhibits no edema or deformity.  Lymphadenopathy:    She has no cervical adenopathy.  Neurological: She is alert and oriented to person, place, and time.  Skin: Skin is warm and dry. Capillary refill takes less than 2 seconds. No rash noted.  Psychiatric: She has a normal mood and affect. Her behavior is normal.  Vitals reviewed.    Depression Screen PHQ 2/9 Scores 06/14/2018  PHQ - 2 Score 1  PHQ- 9 Score 6     Assessment & Plan:     Establish care  Exercise Activities and Dietary recommendations Goals   None      There is no immunization history on file for this patient.  Health Maintenance  Topic Date Due  . HIV Screening  06/26/2002  . TETANUS/TDAP  06/26/2006  . PAP SMEAR  06/26/2008  . INFLUENZA VACCINE  11/09/2018 (Originally 02/09/2018)     Discussed health benefits of physical activity, and encouraged her to engage in regular exercise appropriate for her age and condition.   ROI sent to [facility] [place]   --------------------------------------------------------------------  Problem  List Items Addressed This Visit      Cardiovascular and Mediastinum   POTS (postural  orthostatic tachycardia syndrome)    Followed by cardiology Currently well controlled and asymptomatic We will have to monitor her closely in warmer weather and she may need to resume her Midrin, electrolyte supplementation, compression stockings, and/or Florinef      Migraine with aura    Chronic problem Fairly well controlled and infrequent No need for migraine prophylaxis Can continue to use triptans as needed Would avoid estrogen-containing OCPs given that she does have migraine with aura        Hematopoietic and Hemostatic   Factor XII deficiency (HCC) - Primary    Previously evaluated and worked up by hematology This does not increase patient's risk of VTE and she has no history of VTE When labs are checked, her PTT will always be prolonged She does not require any anticoagulation or special precautions with procedures        Other   Generalized anxiety disorder    Currently well controlled Not currently on any medications Does occasionally have some seasonal affective disorder, which may need to be treated This has been treated well with low-dose Zoloft in the past No treatment at this time, but will continue to monitor      Elevated liver function tests    Previously elevated in the setting of acute cholecystitis We will check CMP and ensure that this has resolved now that she is status post cholecystectomy      Relevant Orders   Comprehensive metabolic panel   History of gestational diabetes    Will check hemoglobin A1c to see what baseline, nonpregnant control of blood glucoses and ensure that she is not prediabetic      Relevant Orders   Hemoglobin A1c    Other Visit Diagnoses    History of anemia       Relevant Orders   CBC w/Diff/Platelet       Return in about 1 year (around 06/15/2019) for CPE.   The entirety of the information documented in the History of Present Illness, Review of Systems and Physical Exam were personally obtained by me.  Portions of this information were initially documented by Presley Raddle, CMA and reviewed by me for thoroughness and accuracy.    Erasmo Downer, MD, MPH [facility] 06/14/2018 1:44 PM

## 2018-06-14 NOTE — Patient Instructions (Signed)

## 2018-06-14 NOTE — Assessment & Plan Note (Signed)
Will check hemoglobin A1c to see what baseline, nonpregnant control of blood glucoses and ensure that she is not prediabetic

## 2018-06-14 NOTE — Assessment & Plan Note (Signed)
Previously evaluated and worked up by hematology This does not increase patient's risk of VTE and she has no history of VTE When labs are checked, her PTT will always be prolonged She does not require any anticoagulation or special precautions with procedures

## 2018-06-14 NOTE — Assessment & Plan Note (Addendum)
Followed by cardiology Currently well controlled and asymptomatic We will have to monitor her closely in warmer weather and she may need to resume her Midrin, electrolyte supplementation, compression stockings, and/or Florinef

## 2018-06-14 NOTE — Assessment & Plan Note (Signed)
Currently well controlled Not currently on any medications Does occasionally have some seasonal affective disorder, which may need to be treated This has been treated well with low-dose Zoloft in the past No treatment at this time, but will continue to monitor

## 2018-06-14 NOTE — Assessment & Plan Note (Signed)
Previously elevated in the setting of acute cholecystitis We will check CMP and ensure that this has resolved now that she is status post cholecystectomy

## 2018-11-13 DIAGNOSIS — Z6821 Body mass index (BMI) 21.0-21.9, adult: Secondary | ICD-10-CM | POA: Diagnosis not present

## 2018-11-13 DIAGNOSIS — Z01419 Encounter for gynecological examination (general) (routine) without abnormal findings: Secondary | ICD-10-CM | POA: Diagnosis not present

## 2019-02-01 ENCOUNTER — Ambulatory Visit (INDEPENDENT_AMBULATORY_CARE_PROVIDER_SITE_OTHER): Payer: 59 | Admitting: Sports Medicine

## 2019-02-01 ENCOUNTER — Other Ambulatory Visit: Payer: Self-pay

## 2019-02-01 ENCOUNTER — Encounter: Payer: Self-pay | Admitting: Sports Medicine

## 2019-02-01 VITALS — BP 90/62 | Ht 59.0 in | Wt 105.0 lb

## 2019-02-01 DIAGNOSIS — M6798 Unspecified disorder of synovium and tendon, other site: Secondary | ICD-10-CM

## 2019-02-01 DIAGNOSIS — M7918 Myalgia, other site: Secondary | ICD-10-CM | POA: Diagnosis not present

## 2019-02-01 DIAGNOSIS — M67951 Unspecified disorder of synovium and tendon, right thigh: Secondary | ICD-10-CM

## 2019-02-01 NOTE — Patient Instructions (Signed)
Thank you for coming to see [place] today.  We think you may have inflammation to the tendons of your gluteus muscles and piriformis.  The best treatment for this is strengthening exercises similar to the ones you have treated in physical therapy before. The goal would be 3 sets of 30. If you need to work up to this, that is fine. You can add resistance as tolerated.  If you feel you need physical therapy again please call our office and we will get this arranged for you.  Please return if you have worsening or no improvement in your pain.  Thank you again for coming to see [place].  Take care,  Dr. Tarry Kos and Dr. Micheline Chapman

## 2019-02-01 NOTE — Progress Notes (Signed)
   PCP: Virginia Crews, MD  Subjective:   HPI: Patient is a 33 y.o. female here for right hip pain x 1 month. This is acute on chronic for patient. She notes she sprained her ankle back in 2006 from a fall in [place] that also had a resulting hip pathology that was never diagnosed. She eventually had physical therapy with improvement in her hip pain. This pain will recur every so often, particularly when she starts exercising more, abnormal movements, and prolonged sitting. She notes she's been walking more and hiking which appears to have caused her current flair. Denies any leg weakness or numbness/tingling. Denies any remote trauma to hip. When this flares up she treats with physical therapy which resolves the pain. They usually treat with ICE, TENS, strengthening exercises. Has tried chiropractor without improvement.   PMH: POTS .  CT abdomen/pelvis on 04/2018 negative for acute or significant osseous findings. She has had imaging of cervical, thoracic, and lumbar spine which were unremarkable. Right hip x-ray in 02/2017 without evidence of arthropathy or other focal bone abnormality.  Review of Systems:  Per HPI.   [place], medications and smoking status reviewed.      Objective:  Physical Exam:  Gen: awake, alert, NAD, comfortable in exam room Pulm: breathing unlabored  Hip, Right:  - Inspection: No gross deformity, no swelling, erythema, or ecchymosis - Palpation: Tender to palpation along posterior greater trochanter and piriformis muscle. No TTP on greater trochanter.  - ROM: Normal range of motion on Flexion, extension, abduction, internal and external rotation - Strength: Normal strength. - Neuro/vasc: NV intact distally - Special Tests: Negative FABER and FADIR.  Positive Trendelenberg.  Negative straight leg raise.  Hip, Left:  - Inspection: No gross deformity, no swelling, erythema, or ecchymosis - Palpation: No TTP specifically along greater trochanter.  -  ROM: Normal range of motion on Flexion, extension, abduction, internal and external rotation - Strength: Normal strength. - Neuro/vasc: NV intact distally - Special Tests: Negative FABER and FADIR.  Positive Trendelenberg.  Negative straight leg raise.   Assessment & Plan:  1. Gluteus Medius Tendonopathy, Acute on chronic Patient likely has tendonoathy of her gluteus medius with involvement of her piriformis muscle as well without sciatica. Exam consistent with pelvic instability likely contributing to this when introducing more strenuous exercises. Given patient has had good improvement in the past with PT, recommended continuing home strengthening exercises. Patient appears to have a good understanding of appropriate strengthening exercises. Will provide referral to PT upon patient request. RTC if no improvement or worsening. Patient understood and agreed to this plan.    Mina Marble, [place], PGY2 02/01/2019 10:04 AM   Patient seen and evaluated with the resident.  I agree with the above plan of care.  Patient has pelvic stabilizer weakness on today's exam which is evident on her positive Trendelenburg.  I explained the importance of continuing with the exercises she learned in physical therapy.  I think if she is diligent about doing those exercises that her pain will improve over the next several weeks.  I think she can continue with recreational exercise using pain as her guide.  If symptoms persist, we can consider referral to formal physical therapy.  Follow-up for ongoing or recalcitrant issues.

## 2019-02-02 IMAGING — CR DG HIP (WITH OR WITHOUT PELVIS) 2-3V*R*
1 series · 3 of 3 positions shown · non-contrast
Comparison: None.

CLINICAL DATA: Chronic right-sided pain.

EXAM:
DG HIP (WITH OR WITHOUT PELVIS) 2-3V RIGHT

[Series 1: dg hip unilat w or w/o pelvis 2-3 views  · non-contrast · 0.14mm/px · 3 of 3 slices shown]
[im 1/3]
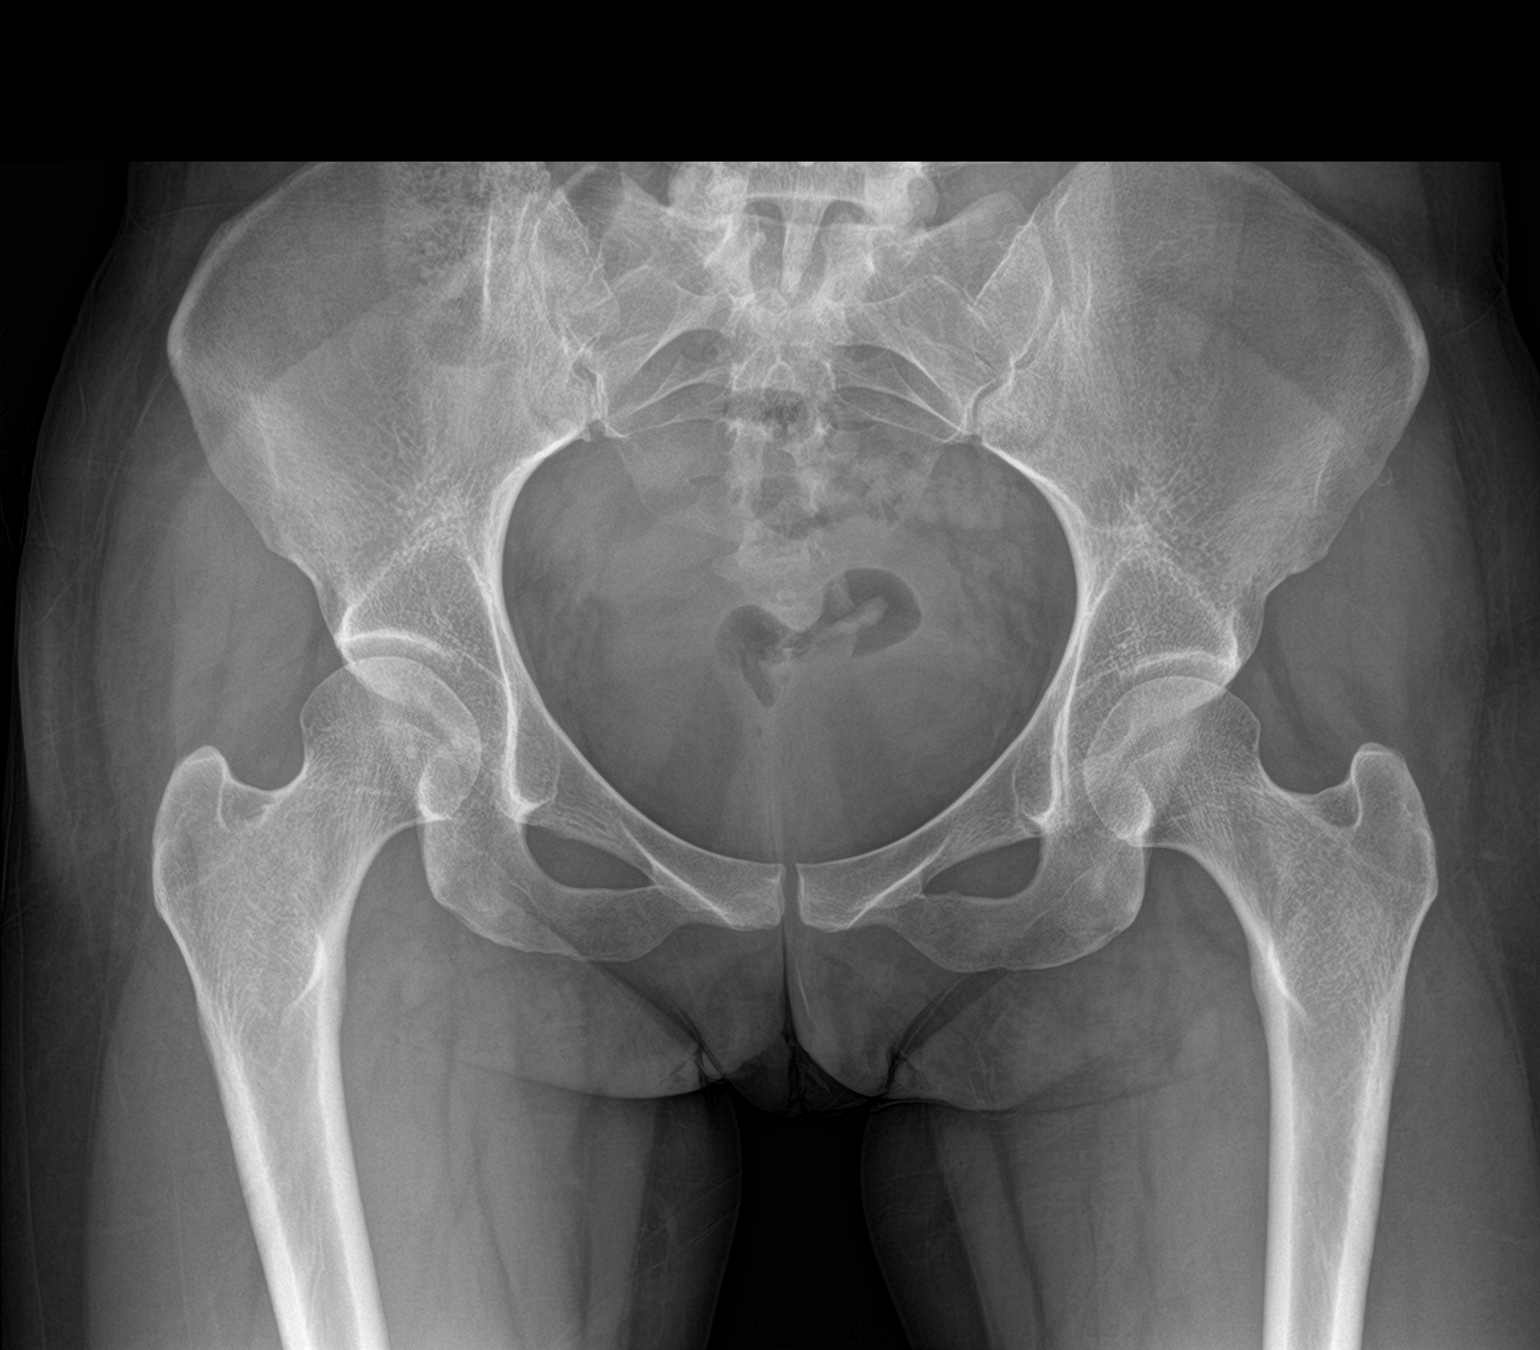
[im 2/3]
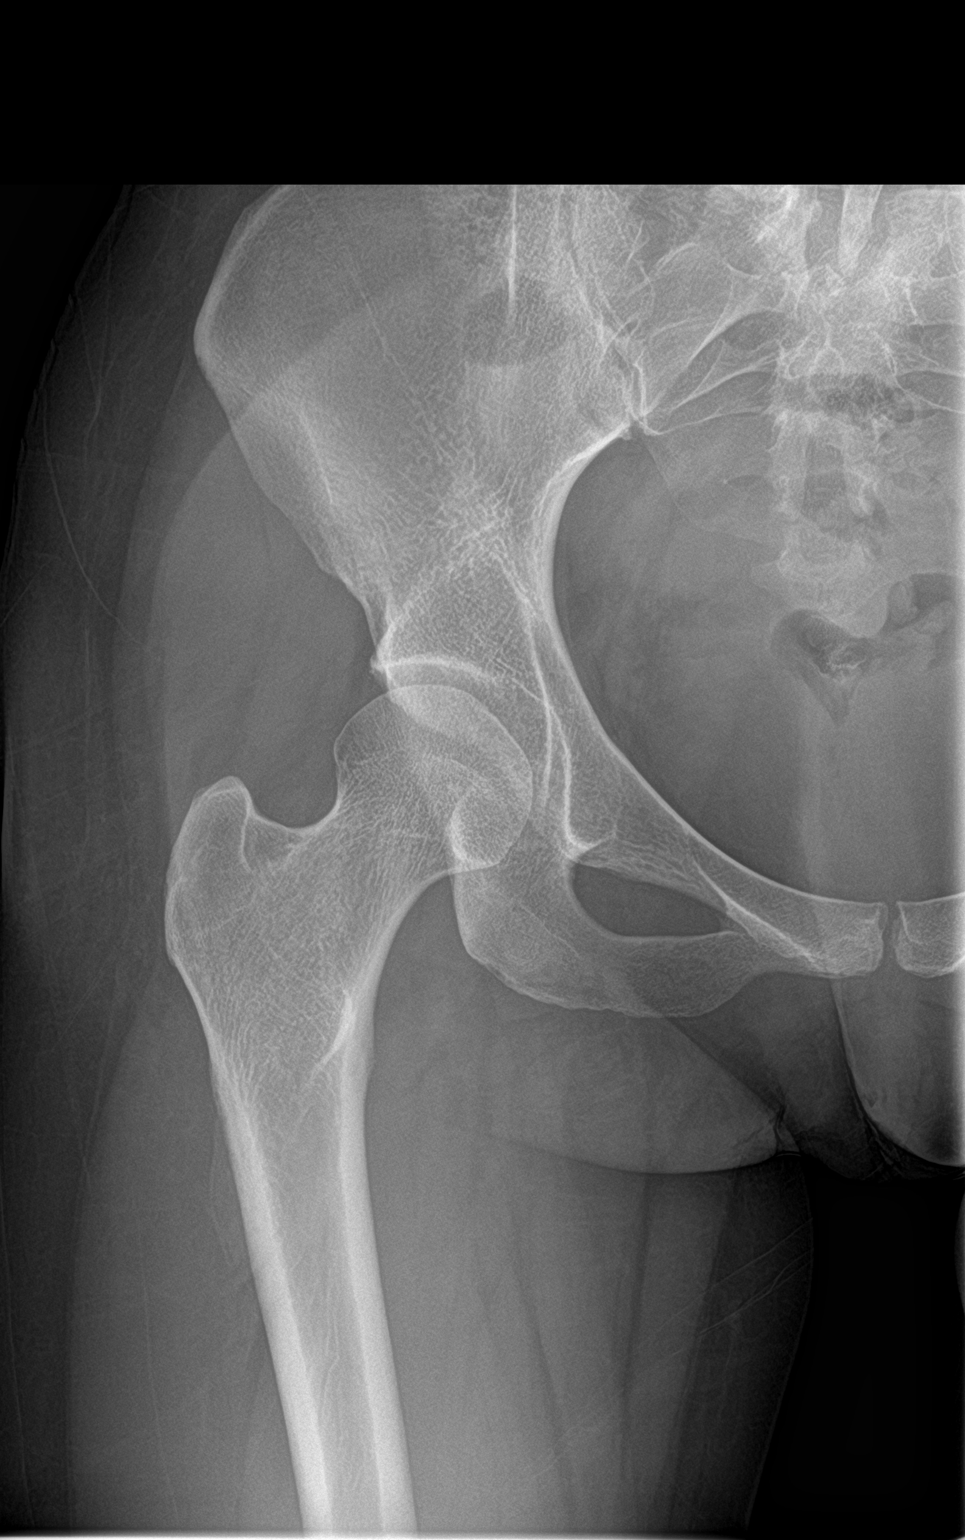
[im 3/3]
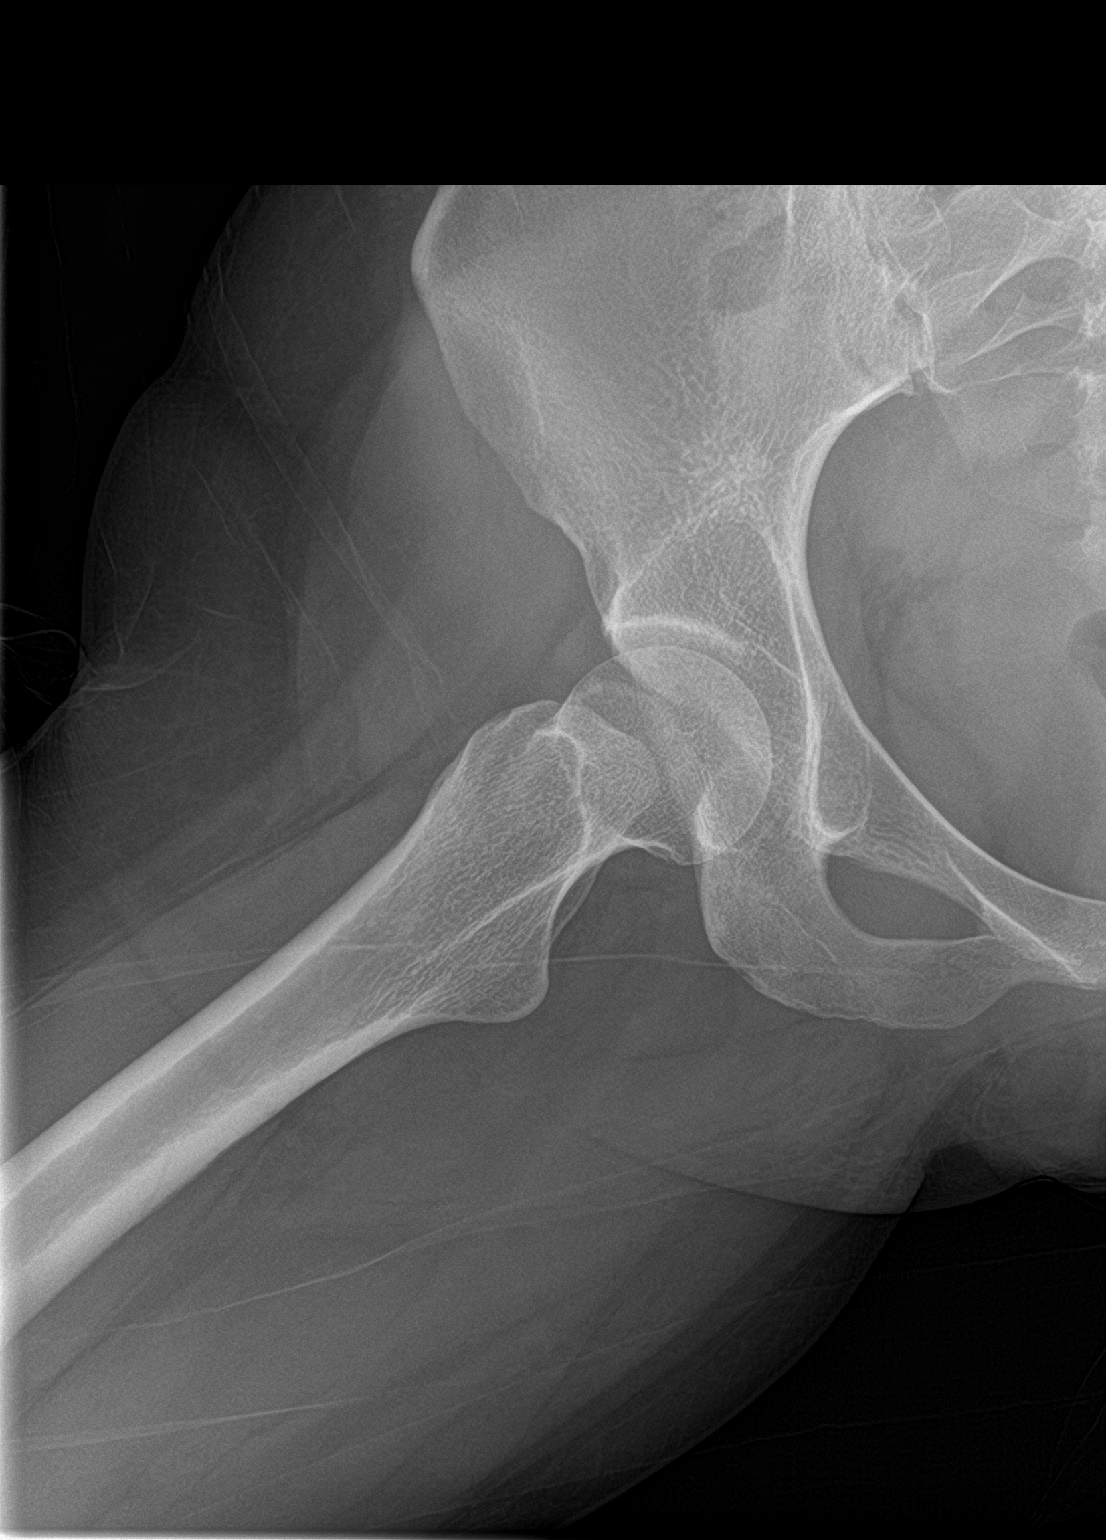

[3 of 3 positions shown; findings below may reference images not displayed]

FINDINGS: There is no evidence of hip fracture or dislocation. There is no
evidence of arthropathy or other focal bone abnormality.
IMPRESSION: Negative.

## 2019-02-02 IMAGING — CR DG LUMBAR SPINE 2-3V
1 series · 3 of 3 positions shown · non-contrast
Comparison: None.

CLINICAL DATA: Chronic right-sided pain.

EXAM:
LUMBAR SPINE - 2-3 VIEW

[Series 1: dg lumbar spine 2-3 views · 0.14mm/px · 3 of 3 slices shown]
[im 1/3]
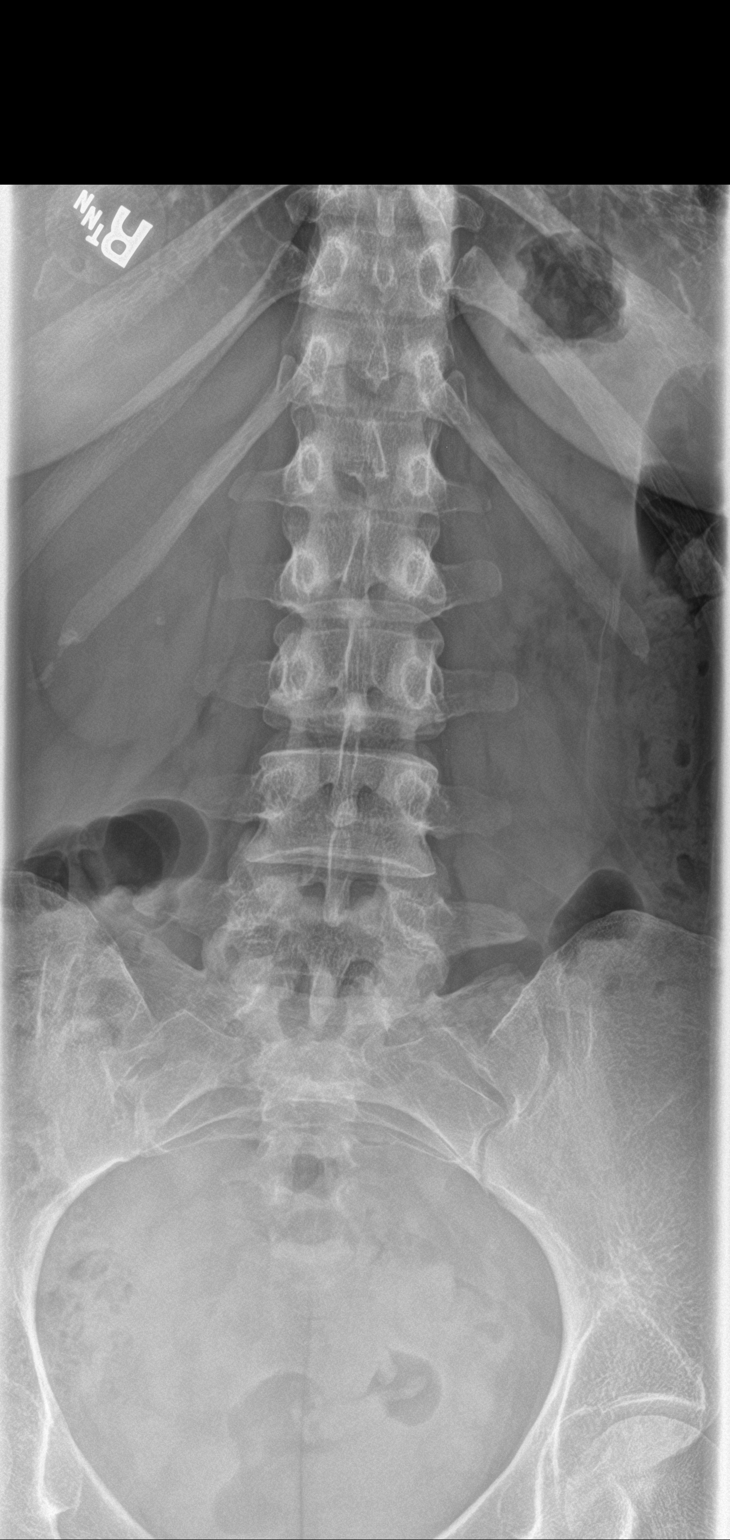
[im 2/3]
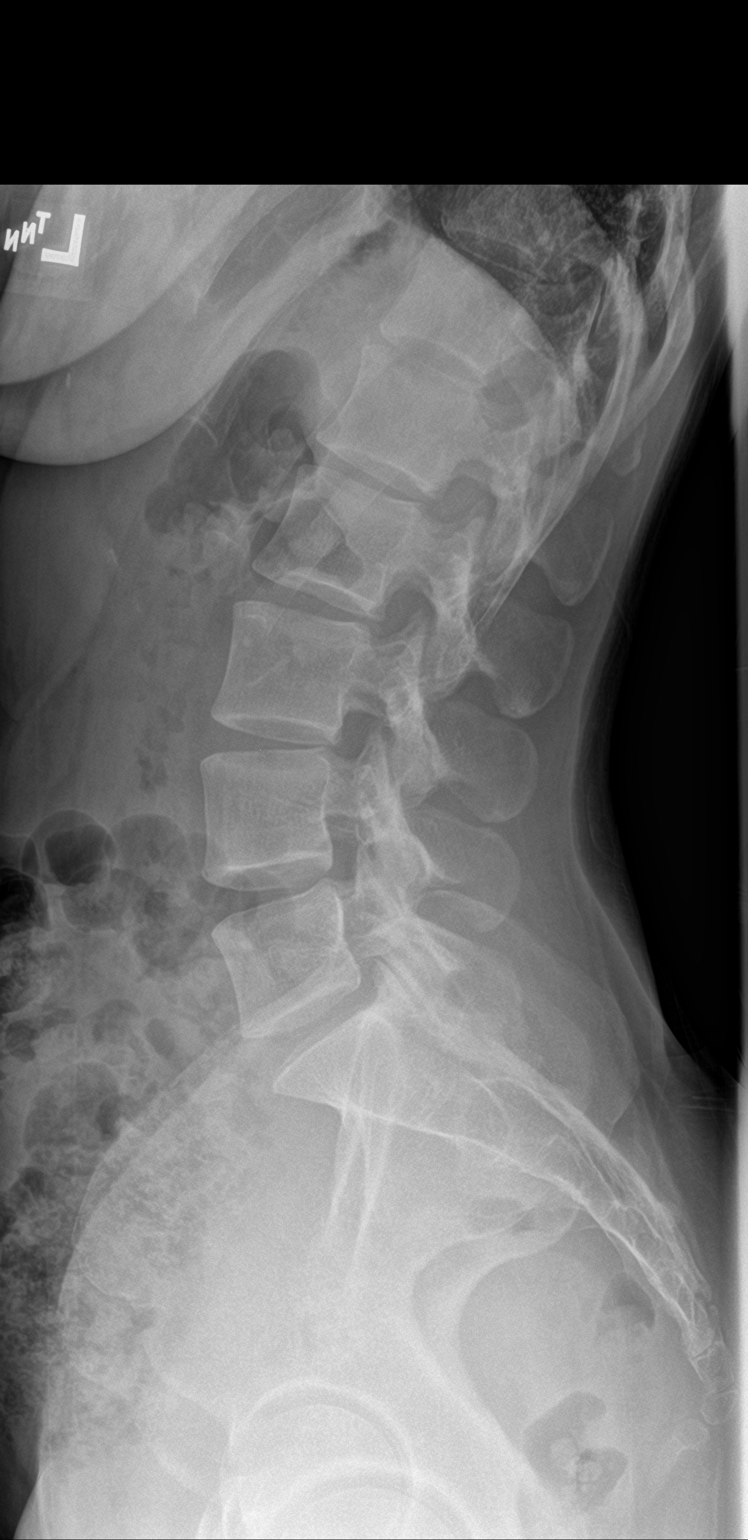
[im 3/3]
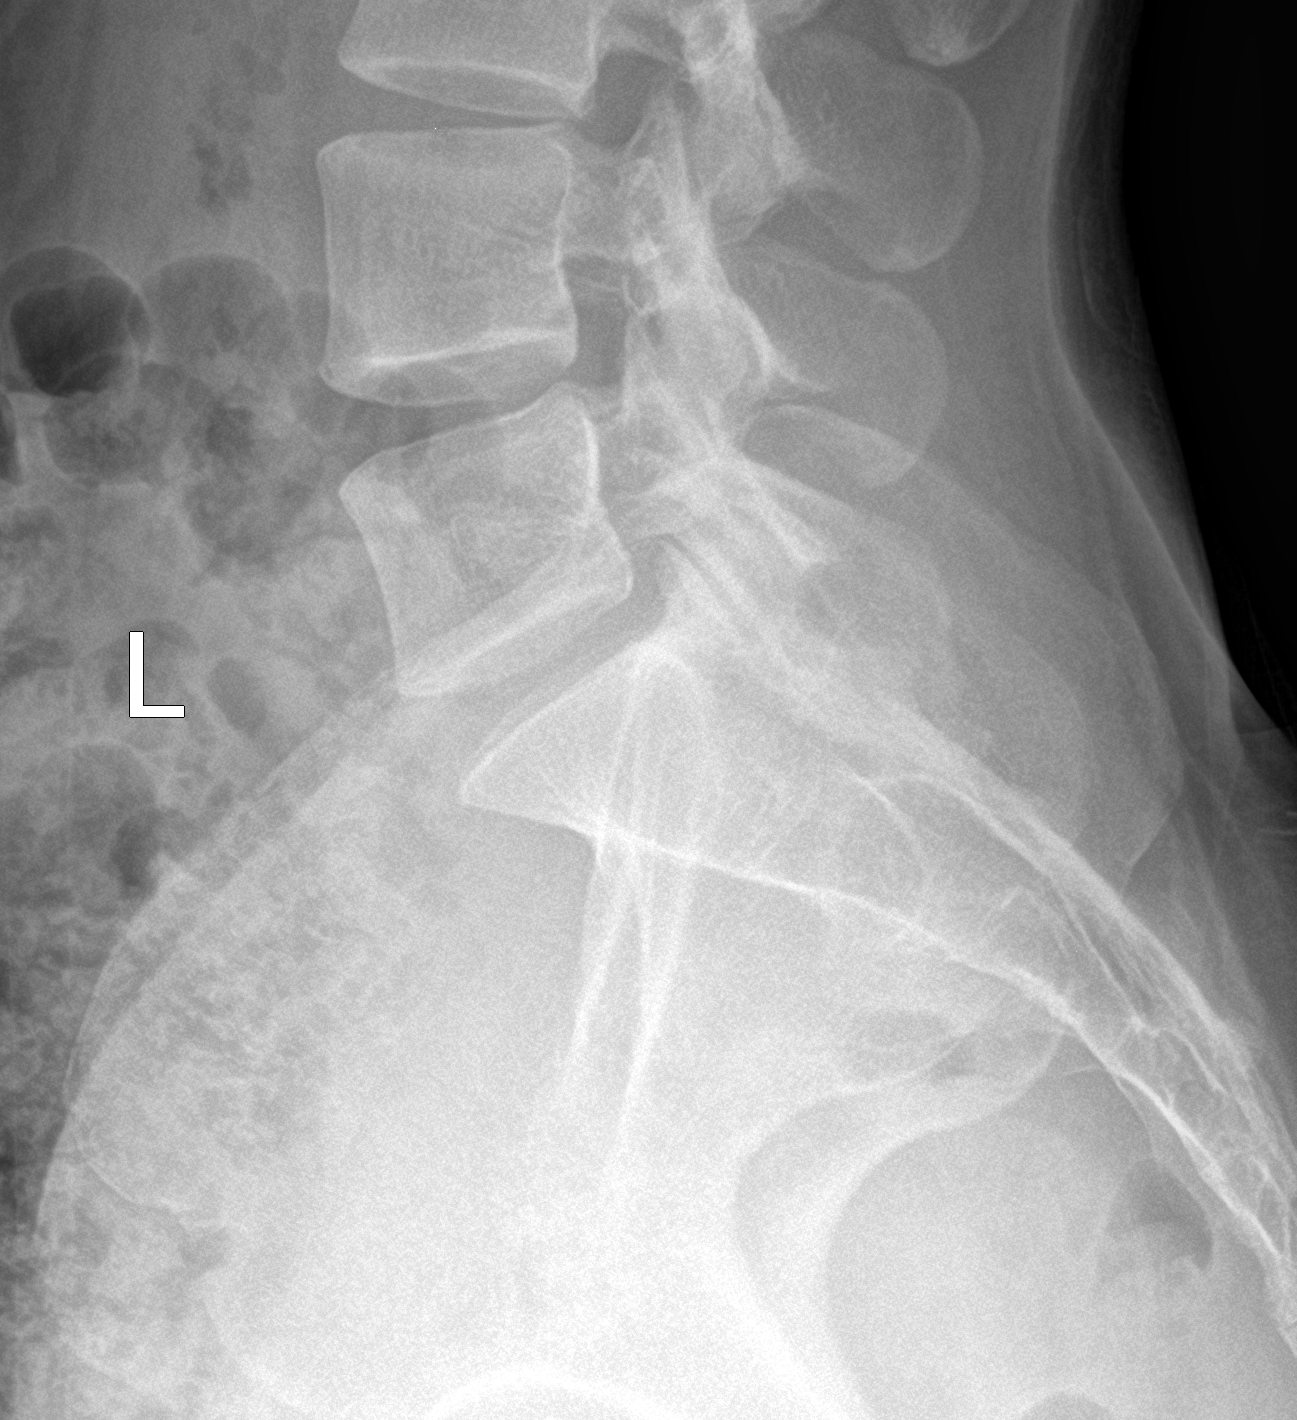

[3 of 3 positions shown; findings below may reference images not displayed]

FINDINGS: There is no evidence of lumbar spine fracture. Alignment is normal.
Intervertebral disc spaces are maintained.
IMPRESSION: Negative.

## 2019-02-27 ENCOUNTER — Telehealth (INDEPENDENT_AMBULATORY_CARE_PROVIDER_SITE_OTHER): Payer: 59 | Admitting: Family Medicine

## 2019-02-27 ENCOUNTER — Other Ambulatory Visit: Payer: Self-pay | Admitting: Family Medicine

## 2019-02-27 ENCOUNTER — Encounter: Payer: Self-pay | Admitting: Family Medicine

## 2019-02-27 DIAGNOSIS — R945 Abnormal results of liver function studies: Secondary | ICD-10-CM

## 2019-02-27 DIAGNOSIS — Z862 Personal history of diseases of the blood and blood-forming organs and certain disorders involving the immune mechanism: Secondary | ICD-10-CM | POA: Diagnosis not present

## 2019-02-27 DIAGNOSIS — Z8632 Personal history of gestational diabetes: Secondary | ICD-10-CM | POA: Diagnosis not present

## 2019-02-27 DIAGNOSIS — J351 Hypertrophy of tonsils: Secondary | ICD-10-CM

## 2019-02-27 DIAGNOSIS — R7989 Other specified abnormal findings of blood chemistry: Secondary | ICD-10-CM

## 2019-02-27 DIAGNOSIS — R5383 Other fatigue: Secondary | ICD-10-CM | POA: Diagnosis not present

## 2019-02-27 NOTE — Addendum Note (Signed)
Addended by: Dan Maker on: 02/27/2019 03:37 PM   Modules accepted: Orders

## 2019-02-27 NOTE — Patient Instructions (Signed)
Infectious Mononucleosis Infectious mononucleosis is a viral infection. It is often referred to as "mono." It causes symptoms that affect various areas of the body, including the throat, upper air passages, and lymph glands. The liver or spleen may also be affected. The virus spreads from person to person (is contagious) through close contact. The illness is usually not serious, and it typically goes away in 2-4 weeks without treatment. In rare cases, symptoms can be more severe and last longer, sometimes up to several months. What are the causes? This condition is commonly caused by the Epstein-Barr virus. This virus spreads through:  Having contact with an infected person's saliva or other bodily fluids, often through: ? Kissing. ? Sex. ? Coughing. ? Sneezing.  Sharing utensils or drinking glasses with an infected person.  Receiving blood from an infected donor (blood transfusion).  Receiving an organ from an infected donor (organ transplant). What increases the risk? You are more likely to develop this condition if:  You are 3915-32 years old. What are the signs or symptoms? Symptoms of this condition usually appear 4-6 weeks after infection. Symptoms may develop slowly and occur at different times. Common symptoms include:  Sore throat.  Headache.  Extreme fatigue.  Muscle aches.  Swollen glands.  Fever.  Poor appetite.  Rash. Other symptoms include:  Enlarged liver or spleen.  Nausea.  Abdominal pain. How is this diagnosed? This condition may be diagnosed based on:  Your medical history.  Your symptoms.  A physical exam.  Blood tests to confirm the diagnosis. How is this treated? There is no cure for this condition. Infectious mononucleosis usually goes away on its own with time. Treatment can help relieve symptoms and may include:  Taking medicines to relieve pain and fever.  Drinking plenty of fluids.  Getting a lot of rest.  Medicine  (corticosteroids)to reduce swelling. This may be used if swelling in the throat causes breathing or swallowing problems. In some severe cases, treatment may have to be given in a hospital. Follow these instructions at home: Medicines  Take over-the-counter and prescription medicines only as told by your health care provider.  Do not take ampicillin or amoxicillin. This may cause a rash.  If you are under 18, do not take aspirin because of the association with Reye's syndrome. Activity  Rest as needed.  Do not participate in any of the following activities until your health care provider approves: ? Contact sports. You may need to wait at least a month before participating in sports. ? Exercise that requires a lot of energy. ? Heavy lifting.  Gradually resume your normal activities after your fever is gone, or when your health care provider tells you that you can. Be sure to rest when you get tired. General instructions   Avoid kissing or sharing utensils or drinking glasses until your health care provider tells you that you are no longer contagious.  Drink enough fluid to keep your urine pale yellow.  Do not drink alcohol.  If you have a sore throat: ? Gargle with a salt-water mixture 3-4 times a day or as needed. To make a salt-water mixture, completely dissolve -1 tsp (3-6 g) of salt in 1 cup (237 mL) of warm water. ? Eat soft foods. Cold foods such as ice cream or ice pops can soothe a sore throat. ? Try sucking on hard candy.  Wash your hands often with soap and water to avoid spreading the infection. If soap and water are not available, use hand  sanitizer.  Keep all follow-up visits as told by your health care provider. This is important. How is this prevented?   Avoid contact with people who are infected with mononucleosis. An infected person may not always appear ill, but he or she can still spread the virus.  Avoid sharing utensils, drinking glasses, or  toothbrushes.  Wash your hands frequently with soap and water. If soap and water are not available, use hand sanitizer.  Use the inside of your elbow to cover your mouth when coughing or sneezing. Contact a health care provider if:  Your fever is not gone after 10 days.  You have swollen lymph nodes that are not back to normal after 4 weeks.  Your activity level is not back to normal after 2 months.  Your skin or the white parts of your eyes turn yellow (jaundice).  You have constipation. This means that you are having: ? Fewer bowel movements in a week than normal. ? Difficulty passing stool. ? Stools that are dry, hard, or larger than normal. Get help right away if:  You have severe pain in your abdomen or shoulder.  You are drooling.  You have trouble swallowing.  You have trouble breathing.  You develop a stiff neck.  You develop a severe headache.  You cannot stop vomiting.  You have jerky movements that you cannot control (seizures).  You are confused.  You have trouble with balance.  Your nose or gums begin to bleed.  You have signs of dehydration. These may include: ? Weakness. ? Sunken eyes. ? Pale skin. ? Dry mouth. ? Rapid breathing or pulse. Summary  Infectious mononucleosis, or "mono," is an infection caused by the Epstein-Barr virus.  The virus that causes this condition is spread through bodily fluids. The virus is most commonly spread by kissing or sharing drinks or utensils with an infected person.  You are more likely to develop this condition if you are 19-56 years old.  Symptoms of this condition include sore throat, headache, fever, swollen glands, muscle aches, extreme fatigue, and swollen liver or spleen.  There is no cure for this condition. Treatment can help relieve symptoms and may include drinking plenty of fluids, getting a lot of rest, and taking medicines. This information is not intended to replace advice given to you by your  health care provider. Make sure you discuss any questions you have with your health care provider. Document Released: 06/25/2000 Document Revised: 10/20/2018 Document Reviewed: 04/12/2018 Elsevier Patient Education  2020 [employer].

## 2019-02-27 NOTE — Progress Notes (Signed)
Patient: Catherine Mendez Female    DOB: Sep 18, 1985   33 y.o.   MRN: [id number] Visit Date: 02/27/2019  Today's Provider: Lavon Paganini, MD   Chief Complaint  Patient presents with  . Fatigue   Subjective:    I, [name] CMA, am acting as a scribe for Lavon Paganini, MD.   Virtual Visit via Video Note  I connected with Catherine Mendez on 02/27/19 at 10:20 AM EDT by a video enabled telemedicine application and verified that I am speaking with the correct person using two identifiers.   Patient location: home Provider location: [place] involved in the visit: patient, provider    I discussed the limitations of evaluation and management by telemedicine and the availability of in person appointments. The patient expressed understanding and agreed to proceed.  HPI  Fatigue Patient reports that she has had fatigue for 2 weeks off and on. She also states that her tonsils have white spots and are large. Patient states throat is not sore or hurt. Patient denies any other symptoms at this time.  Allergies  Allergen Reactions  . Augmentin [Amoxicillin-Pot Clavulanate]     diarhea  . Iodinated Diagnostic Agents Rash  . Shellfish-Derived Products Rash     Current Outpatient Medications:  .  acetaminophen (TYLENOL) 500 MG tablet, Take 500 mg by mouth every 6 (six) hours as needed., Disp: , Rfl:  .  DYMISTA 137-50 MCG/ACT SUSP, Place 2 sprays into both nostrils 2 (two) times daily., Disp: , Rfl: 12 .  Fremanezumab-vfrm (AJOVY) 225 MG/1.5ML SOSY, Inject into the skin., Disp: , Rfl:  .  ibuprofen (ADVIL,MOTRIN) 600 MG tablet, Take 1 tablet (600 mg total) by mouth every 8 (eight) hours as needed for fever or mild pain., Disp: 30 tablet, Rfl: 0 .  ondansetron (ZOFRAN) 4 MG tablet, ondansetron HCl 4 mg tablet  TAKE 1 TABLET BY MOUTH EVERY 8 HOURS AS NEEDED FOR NAUSEA, Disp: , Rfl:  .  Probiotic Product (PROBIOTIC ADVANCED PO), Take by mouth.,  Disp: , Rfl:  .  SUMAtriptan (IMITREX) 100 MG tablet, Take 100 mg by mouth every 2 (two) hours as needed. , Disp: , Rfl:   Review of Systems  Constitutional: Positive for fatigue.  HENT: Negative.   Respiratory: Negative.   Genitourinary: Negative.   Neurological: Negative.     Social History   Tobacco Use  . Smoking status: Never Smoker  . Smokeless tobacco: Never Used  Substance Use Topics  . Alcohol use: Yes    Comment: wine occassionally      Objective:   There were no vitals taken for this visit. There were no vitals filed for this visit.   Physical Exam Constitutional:      General: She is not in acute distress.    Appearance: Normal appearance.  HENT:     Head: Normocephalic and atraumatic.     Mouth/Throat:     Comments: Tonsils slightly enlarged and white plaques Pulmonary:     Effort: Pulmonary effort is normal. No respiratory distress.  Neurological:     Mental Status: She is alert and oriented to person, place, and time. Mental status is at baseline.  Psychiatric:        Mood and Affect: Mood normal.        Behavior: Behavior normal.      No results found for any visits on 02/27/19.     Assessment & Plan    I discussed the assessment  and treatment plan with the patient. The patient was provided an opportunity to ask questions and all were answered. The patient agreed with the plan and demonstrated an understanding of the instructions.   The patient was advised to call back or seek an in-person evaluation if the symptoms worsen or if the condition fails to improve as anticipated.  Problem List Items Addressed This Visit      Other   Elevated liver function tests   Relevant Orders   Comprehensive metabolic panel   History of gestational diabetes   Relevant Orders   Hemoglobin A1c    Other Visit Diagnoses    Other fatigue    -  Primary   Relevant Orders   TSH   Epstein-Barr virus VCA antibody panel   VITAMIN D 25 Hydroxy (Vit-D Deficiency,  Fractures)   B12   Enlarged tonsils       Relevant Orders   Epstein-Barr virus VCA antibody panel   History of anemia       Relevant Orders   CBC w/Diff/Platelet   B12      Fatigue and enlarged tonsils - concerning for Mono infection - discussed low suspicion for COVID - doubt strep throat given no sore throat - will also check CBC and TSH, as well as Vit B12 and Vit D   Return if symptoms worsen or fail to improve.   The entirety of the information documented in the History of Present Illness, Review of Systems and Physical Exam were personally obtained by me. Portions of this information were initially documented by [facility] McClurkin, CMA and reviewed by me for thoroughness and accuracy.    Cesilia Shinn, Marzella SchleinAngela M, MD MPH [facility]

## 2019-03-01 ENCOUNTER — Telehealth: Payer: Self-pay

## 2019-03-01 LAB — COMPREHENSIVE METABOLIC PANEL
ALT: 33 IU/L — ABNORMAL HIGH (ref 0–32)
AST: 28 IU/L (ref 0–40)
Albumin/Globulin Ratio: 1.8 (ref 1.2–2.2)
Albumin: 4.2 g/dL (ref 3.8–4.8)
Alkaline Phosphatase: 95 IU/L (ref 39–117)
BUN/Creatinine Ratio: 10 (ref 9–23)
BUN: 7 mg/dL (ref 6–20)
Bilirubin Total: 0.3 mg/dL (ref 0.0–1.2)
CO2: 20 mmol/L (ref 20–29)
Calcium: 9 mg/dL (ref 8.7–10.2)
Chloride: 102 mmol/L (ref 96–106)
Creatinine, Ser: 0.7 mg/dL (ref 0.57–1.00)
GFR calc Af Amer: 134 mL/min/{1.73_m2} (ref >59)
GFR calc non Af Amer: 116 mL/min/{1.73_m2} (ref >59)
Globulin, Total: 2.4 g/dL (ref 1.5–4.5)
Glucose: 88 mg/dL (ref 65–99)
Potassium: 3.9 mmol/L (ref 3.5–5.2)
Sodium: 139 mmol/L (ref 134–144)
Total Protein: 6.6 g/dL (ref 6.0–8.5)

## 2019-03-01 LAB — CBC WITH DIFFERENTIAL/PLATELET
Basophils Absolute: 0 10*3/uL (ref 0.0–0.2)
Basos: 0 %
EOS (ABSOLUTE): 0 10*3/uL (ref 0.0–0.4)
Eos: 1 %
Hematocrit: 34.9 % (ref 34.0–46.6)
Hemoglobin: 11.7 g/dL (ref 11.1–15.9)
Immature Grans (Abs): 0 10*3/uL (ref 0.0–0.1)
Immature Granulocytes: 1 %
Lymphocytes Absolute: 1.7 10*3/uL (ref 0.7–3.1)
Lymphs: 27 %
MCH: 30.9 pg (ref 26.6–33.0)
MCHC: 33.5 g/dL (ref 31.5–35.7)
MCV: 92 fL (ref 79–97)
Monocytes Absolute: 0.6 10*3/uL (ref 0.1–0.9)
Monocytes: 9 %
Neutrophils Absolute: 3.9 10*3/uL (ref 1.4–7.0)
Neutrophils: 62 %
Platelets: 248 10*3/uL (ref 150–450)
RBC: 3.79 x10E6/uL (ref 3.77–5.28)
RDW: 11.9 % (ref 11.7–15.4)
WBC: 6.2 10*3/uL (ref 3.4–10.8)

## 2019-03-01 LAB — HEMOGLOBIN A1C
Est. average glucose Bld gHb Est-mCnc: 100 mg/dL
Hgb A1c MFr Bld: 5.1 % (ref 4.8–5.6)

## 2019-03-01 LAB — EPSTEIN-BARR VIRUS (EBV) ANTIBODY PROFILE
EBV NA IgG: >600 U/mL — ABNORMAL HIGH (ref 0.0–17.9)
EBV VCA IgG: 76.2 U/mL — ABNORMAL HIGH (ref 0.0–17.9)
EBV VCA IgM: <36 U/mL (ref 0.0–35.9)

## 2019-03-01 LAB — VITAMIN B12: Vitamin B-12: 478 pg/mL (ref 232–1245)

## 2019-03-01 LAB — VITAMIN D 25 HYDROXY (VIT D DEFICIENCY, FRACTURES): Vit D, 25-Hydroxy: 27.5 ng/mL — ABNORMAL LOW (ref 30.0–100.0)

## 2019-03-01 LAB — TSH: TSH: 1.23 u[IU]/mL (ref 0.450–4.500)

## 2019-03-01 NOTE — Telephone Encounter (Signed)
-----   Message from Virginia Crews, MD sent at 03/01/2019 11:24 AM EDT ----- Normal labs, except Vit D level is low.  This could contribute to fatigue. Recommend 2000 units daily of Vit D3.  Mono testing shows history of infection, but no current infection

## 2019-03-01 NOTE — Telephone Encounter (Signed)
LVMTRC 

## 2019-03-02 NOTE — Telephone Encounter (Signed)
Viewed by Dorothea Ogle on 03/01/2019 11:25 AM on mychart.

## 2019-03-17 ENCOUNTER — Encounter: Payer: Self-pay | Admitting: Family Medicine

## 2019-03-20 NOTE — Progress Notes (Signed)
Patient: Catherine Mendez Female    DOB: 1985/10/10   33 y.o.   MRN: [id number] Visit Date: 03/21/2019  Today's Provider: Lavon Paganini, MD   No chief complaint on file.  Subjective:    I, Catherine Mendez CMA, am acting as a scribe for Lavon Paganini, MD.   Virtual Visit via Video Note  I connected with [name] on 03/21/19 at 11:00 AM EDT by a video enabled telemedicine application and verified that I am speaking with the correct person using two identifiers.   Patient location: home Provider location: [place] involved in the visit: patient, provider   I discussed the limitations of evaluation and management by telemedicine and the availability of in person appointments. The patient expressed understanding and agreed to proceed.  HPI  Allergies Patient presents today for allergies follow-up.  Patient states that her ENT doctor would not fill a nose spray for her because she needed appointment. Patient states she did not want to go in office and they did not have virtual visits. Patient states that she would like to discuss her nose spray.Patient states that she only have problems with her allergies during seasonal changes but not summer.  Her refills had expired as she does not refill it monthly.  She is    Patient states she is having bilateral leg bruising. Unclear what caused it. Healing up.  Allergies  Allergen Reactions  . Augmentin [Amoxicillin-Pot Clavulanate]     diarhea  . Iodinated Diagnostic Agents Rash  . Shellfish-Derived Products Rash     Current Outpatient Medications:  .  acetaminophen (TYLENOL) 500 MG tablet, Take 500 mg by mouth every 6 (six) hours as needed., Disp: , Rfl:  .  Fremanezumab-vfrm (AJOVY) 225 MG/1.5ML SOSY, Inject into the skin., Disp: , Rfl:  .  ibuprofen (ADVIL,MOTRIN) 600 MG tablet, Take 1 tablet (600 mg total) by mouth every 8 (eight) hours as needed for fever or mild pain., Disp: 30  tablet, Rfl: 0 .  ondansetron (ZOFRAN) 4 MG tablet, ondansetron HCl 4 mg tablet  TAKE 1 TABLET BY MOUTH EVERY 8 HOURS AS NEEDED FOR NAUSEA, Disp: , Rfl:  .  Probiotic Product (PROBIOTIC ADVANCED PO), Take by mouth., Disp: , Rfl:  .  SUMAtriptan (IMITREX) 100 MG tablet, Take 100 mg by mouth every 2 (two) hours as needed. , Disp: , Rfl:  .  azelastine (ASTELIN) 0.1 % nasal spray, Place 1 spray into both nostrils 2 (two) times daily. Use in each nostril as directed, Disp: 30 mL, Rfl: 6 .  fluticasone (FLONASE) 50 MCG/ACT nasal spray, Place 2 sprays into both nostrils daily., Disp: 16 g, Rfl: 6  Review of Systems  Constitutional: Negative.   HENT: Negative for congestion, sinus pressure and sinus pain.   Eyes: Negative.   Respiratory: Negative for cough, shortness of breath and wheezing.   Allergic/Immunologic: Positive for environmental allergies.  Hematological: Bruises/bleeds easily.    Social History   Tobacco Use  . Smoking status: Never Smoker  . Smokeless tobacco: Never Used  Substance Use Topics  . Alcohol use: Yes    Comment: wine occassionally      Objective:   There were no vitals taken for this visit. There were no vitals filed for this visit.There is no height or weight on file to calculate BMI.   Physical Exam Constitutional:      General: She is not in acute distress.    Appearance: Normal appearance.  HENT:     Head: Normocephalic and atraumatic.  Eyes:     General: No scleral icterus.    Conjunctiva/sclera: Conjunctivae normal.  Pulmonary:     Effort: Pulmonary effort is normal. No respiratory distress.  Skin:    Comments: See pictures from patient message  Neurological:     Mental Status: She is alert and oriented to person, place, and time. Mental status is at baseline.  Psychiatric:        Mood and Affect: Mood normal.        Behavior: Behavior normal.      No results found for any visits on 03/21/19.     Assessment & Plan    I discussed the  assessment and treatment plan with the patient. The patient was provided an opportunity to ask questions and all were answered. The patient agreed with the plan and demonstrated an understanding of the instructions.   The patient was advised to call back or seek an in-person evaluation if the symptoms worsen or if the condition fails to improve as anticipated.  1. Seasonal allergic rhinitis due to pollen - seasonal - previously well controlled with Dymista, but no longer covered by insurance - will Rx flonase and Azelastine instead separately - discussed use  2. Bruising - intermittent problem - discussed that steroids and NSAIDs may increase prevalence - if worsens or develops gum bleeding or other symptoms, would check CBC, but platelets last month 248    Meds ordered this encounter  Medications  . fluticasone (FLONASE) 50 MCG/ACT nasal spray    Sig: Place 2 sprays into both nostrils daily.    Dispense:  16 g    Refill:  6  . azelastine (ASTELIN) 0.1 % nasal spray    Sig: Place 1 spray into both nostrils 2 (two) times daily. Use in each nostril as directed    Dispense:  30 mL    Refill:  6     Return if symptoms worsen or fail to improve.   The entirety of the information documented in the History of Present Illness, Review of Systems and Physical Exam were personally obtained by me. Portions of this information were initially documented by [facility], CMA and reviewed by me for thoroughness and accuracy.    Bacigalupo, Marzella SchleinAngela M, MD MPH [facility]

## 2019-03-21 ENCOUNTER — Telehealth: Payer: Self-pay | Admitting: Family Medicine

## 2019-03-21 ENCOUNTER — Telehealth (INDEPENDENT_AMBULATORY_CARE_PROVIDER_SITE_OTHER): Payer: 59 | Admitting: Family Medicine

## 2019-03-21 DIAGNOSIS — T148XXA Other injury of unspecified body region, initial encounter: Secondary | ICD-10-CM

## 2019-03-21 DIAGNOSIS — J301 Allergic rhinitis due to pollen: Secondary | ICD-10-CM | POA: Diagnosis not present

## 2019-03-21 MED ORDER — FLUTICASONE PROPIONATE 50 MCG/ACT NA SUSP: 2.0000 | Freq: Every day | NASAL | 6 refills | Status: AC

## 2019-03-21 MED ORDER — AZELASTINE HCL 0.1 % NA SOLN: 1.0000 | Freq: Two times a day (BID) | NASAL | 6 refills | Status: AC

## 2019-03-21 NOTE — Telephone Encounter (Signed)
Pt returned missed call. ° °Please call her back. ° °Thanks, °TGH °

## 2019-03-21 NOTE — Telephone Encounter (Signed)
Visit completed.

## 2019-03-21 NOTE — Patient Instructions (Signed)

## 2019-06-18 ENCOUNTER — Encounter: Payer: Self-pay | Admitting: Family Medicine

## 2019-06-18 ENCOUNTER — Other Ambulatory Visit: Payer: Self-pay

## 2019-06-18 ENCOUNTER — Ambulatory Visit (INDEPENDENT_AMBULATORY_CARE_PROVIDER_SITE_OTHER): Payer: 59 | Admitting: Family Medicine

## 2019-06-18 VITALS — BP 103/75 | HR 97 | Temp 96.9°F | Resp 16 | Ht 59.5 in | Wt 105.2 lb

## 2019-06-18 DIAGNOSIS — Z0000 Encounter for general adult medical examination without abnormal findings: Secondary | ICD-10-CM | POA: Diagnosis not present

## 2019-06-18 NOTE — Patient Instructions (Addendum)
Pepcid (famotidine) as needed for heartburn   Preventive Care 57-32 Years Old, Female Preventive care refers to visits with your health care provider and lifestyle choices that can promote health and wellness. This includes:  A yearly physical exam. This may also be called an annual well check.  Regular dental visits and eye exams.  Immunizations.  Screening for certain conditions.  Healthy lifestyle choices, such as eating a healthy diet, getting regular exercise, not using drugs or products that contain nicotine and tobacco, and limiting alcohol use. What can I expect for my preventive care visit? Physical exam Your health care provider will check your:  Height and weight. This may be used to calculate body mass index (BMI), which tells if you are at a healthy weight.  Heart rate and blood pressure.  Skin for abnormal spots. Counseling Your health care provider may ask you questions about your:  Alcohol, tobacco, and drug use.  Emotional well-being.  Home and relationship well-being.  Sexual activity.  Eating habits.  Work and work Statistician.  Method of birth control.  Menstrual cycle.  Pregnancy history. What immunizations do I need?  Influenza (flu) vaccine  This is recommended every year. Tetanus, diphtheria, and pertussis (Tdap) vaccine  You may need a Td booster every 10 years. Varicella (chickenpox) vaccine  You may need this if you have not been vaccinated. Human papillomavirus (HPV) vaccine  If recommended by your health care provider, you may need three doses over 6 months. Measles, mumps, and rubella (MMR) vaccine  You may need at least one dose of MMR. You may also need a second dose. Meningococcal conjugate (MenACWY) vaccine  One dose is recommended if you are age 38-21 years and a first-year college student living in a residence hall, or if you have one of several medical conditions. You may also need additional booster doses.  Pneumococcal conjugate (PCV13) vaccine  You may need this if you have certain conditions and were not previously vaccinated. Pneumococcal polysaccharide (PPSV23) vaccine  You may need one or two doses if you smoke cigarettes or if you have certain conditions. Hepatitis A vaccine  You may need this if you have certain conditions or if you travel or work in places where you may be exposed to hepatitis A. Hepatitis B vaccine  You may need this if you have certain conditions or if you travel or work in places where you may be exposed to hepatitis B. Haemophilus influenzae type b (Hib) vaccine  You may need this if you have certain conditions. You may receive vaccines as individual doses or as more than one vaccine together in one shot (combination vaccines). Talk with your health care provider about the risks and benefits of combination vaccines. What tests do I need?  Blood tests  Lipid and cholesterol levels. These may be checked every 5 years starting at age 21.  Hepatitis C test.  Hepatitis B test. Screening  Diabetes screening. This is done by checking your blood sugar (glucose) after you have not eaten for a while (fasting).  Sexually transmitted disease (STD) testing.  BRCA-related cancer screening. This may be done if you have a family history of breast, ovarian, tubal, or peritoneal cancers.  Pelvic exam and Pap test. This may be done every 3 years starting at age 23. Starting at age 51, this may be done every 5 years if you have a Pap test in combination with an HPV test. Talk with your health care provider about your test results, treatment options,  and if necessary, the need for more tests. Follow these instructions at home: Eating and drinking   Eat a diet that includes fresh fruits and vegetables, whole grains, lean protein, and low-fat dairy.  Take vitamin and mineral supplements as recommended by your health care provider.  Do not drink alcohol if: ? Your  health care provider tells you not to drink. ? You are pregnant, may be pregnant, or are planning to become pregnant.  If you drink alcohol: ? Limit how much you have to 0-1 drink a day. ? Be aware of how much alcohol is in your drink. In the [place] one drink equals one 12 oz bottle of beer (355 mL), one 5 oz glass of wine (148 mL), or one 1 oz glass of hard liquor (44 mL). Lifestyle  Take daily care of your teeth and gums.  Stay active. Exercise for at least 30 minutes on 5 or more days each week.  Do not use any products that contain nicotine or tobacco, such as cigarettes, e-cigarettes, and chewing tobacco. If you need help quitting, ask your health care provider.  If you are sexually active, practice safe sex. Use a condom or other form of birth control (contraception) in order to prevent pregnancy and STIs (sexually transmitted infections). If you plan to become pregnant, see your health care provider for a preconception visit. What's next?  Visit your health care provider once a year for a well check visit.  Ask your health care provider how often you should have your eyes and teeth checked.  Stay up to date on all vaccines. This information is not intended to replace advice given to you by your health care provider. Make sure you discuss any questions you have with your health care provider. Document Released: 08/24/2001 Document Revised: 03/09/2018 Document Reviewed: 03/09/2018 Elsevier Patient Education  2020 [employer].

## 2019-06-18 NOTE — Progress Notes (Signed)
Patient: Catherine Mendez, Female    DOB: [birth year], 33 y.o.   MRN: [id number] Visit Date: 06/18/2019  Today's Provider: Lavon Paganini, MD   Chief Complaint  Patient presents with  . Annual Exam   Subjective:     Annual physical exam Catherine Mendez is a 33 y.o. female who presents today for health maintenance and complete physical. She feels well. She reports exercising daily. She reports she is sleeping well. 37 for women, 2020  -----------------------------------------------------------------   Review of Systems  Constitutional: Negative.   HENT: Negative.   Eyes: Negative.   Respiratory: Negative.   Cardiovascular: Negative.   Gastrointestinal: Negative.   Endocrine: Negative.   Genitourinary: Negative.   Musculoskeletal: Negative.   Skin: Negative.   Allergic/Immunologic: Positive for environmental allergies and food allergies. Negative for immunocompromised state.  Neurological: Positive for headaches. Negative for dizziness, tremors, seizures, syncope, facial asymmetry, speech difficulty, weakness, light-headedness and numbness.  Hematological: Negative.   Psychiatric/Behavioral: Negative.     Social History      She  reports that she has never smoked. She has never used smokeless tobacco. She reports current alcohol use. She reports that she does not use drugs.       Social History   Socioeconomic History  . Marital status: Married    Spouse name: Not on file  . Number of children: 1  . Years of education: Not on file  . Highest education level: Not on file  Occupational History  . Occupation: Art therapist    Comment: Programmer, multimedia  Social Needs  . Financial resource strain: Not on file  . Food insecurity    Worry: Not on file    Inability: Not on file  . Transportation needs    Medical: Not on file    Non-medical: Not on file  Tobacco Use  . Smoking status: Never Smoker  . Smokeless tobacco: Never Used  Substance and  Sexual Activity  . Alcohol use: Yes    Comment: wine occassionally  . Drug use: No  . Sexual activity: Yes    Partners: Male    Birth control/protection: Condom  Lifestyle  . Physical activity    Days per week: Not on file    Minutes per session: Not on file  . Stress: Not on file  Relationships  . Social Herbalist on phone: Not on file    Gets together: Not on file    Attends religious service: Not on file    Active member of club or organization: Not on file    Attends meetings of clubs or organizations: Not on file    Relationship status: Not on file  Other Topics Concern  . Not on file  Social History Narrative   ** Merged History Encounter **        Past Medical History:  Diagnosis Date  . Anemia    in past   . Anxiety    medicine in college for deopression /anxiety   . Blood transfusion without reported diagnosis    2 with childbirth/ choleithias with pregnancy  . Clotting disorder ([place])   . Elevated liver function tests 06/10/2015  . 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 Gestational diabetes 2016   gestational  . POTS (postural orthostatic tachycardia syndrome)      Patient Active Problem List   Diagnosis Date Noted  . History of gestational diabetes 06/14/2018  . Menorrhagia with regular cycle 01/02/2018  . Migraine  with aura 06/26/2017  . Generalized anxiety disorder 06/09/2017  . 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 POTS (postural orthostatic tachycardia syndrome) 11/05/2016  . Elevated liver function tests 06/10/2015    Past Surgical History:  Procedure Laterality Date  . CHOLECYSTECTOMY N/A 05/07/2018   Procedure: LAPAROSCOPIC CHOLECYSTECTOMY;  Surgeon: Henrene DodgePiscoya, Jose, MD;  Location: [facility] ORS;  Service: General;  Laterality: N/A;  . COSMETIC SURGERY     dental implant   . MOUTH SURGERY  2013   bottom dental implant     Family History        Family Status  Relation Name Status  . Mother  Alive  . Father  Alive  . Sister  Alive   . Mat Aunt  (Not Specified)  . MGM  Alive  . MGF  Deceased  . PGM  Deceased  . PGF  Deceased  . Daughter  Alive        Her family history includes Aneurysm in her maternal aunt; Bleeding Disorder in her mother and sister; Cirrhosis in her paternal grandmother; Clotting disorder in her mother; Colon cancer (age of onset: 1560) in her maternal grandfather and maternal grandmother; Fibromyalgia in her mother; Healthy in her daughter; Heart disease (age of onset: 2370) in her paternal grandfather; Hypertension in her father; Lung cancer in her maternal grandfather.      Allergies  Allergen Reactions  . Augmentin [Amoxicillin-Pot Clavulanate]     diarhea  . Iodinated Diagnostic Agents Rash  . Shellfish-Derived Products Rash     Current Outpatient Medications:  .  acetaminophen (TYLENOL) 500 MG tablet, Take 500 mg by mouth every 6 (six) hours as needed., Disp: , Rfl:  .  azelastine (ASTELIN) 0.1 % nasal spray, Place 1 spray into both nostrils 2 (two) times daily. Use in each nostril as directed, Disp: 30 mL, Rfl: 6 .  fluticasone (FLONASE) 50 MCG/ACT nasal spray, Place 2 sprays into both nostrils daily., Disp: 16 g, Rfl: 6 .  Fremanezumab-vfrm (AJOVY) 225 MG/1.5ML SOSY, Inject into the skin., Disp: , Rfl:  .  ibuprofen (ADVIL,MOTRIN) 600 MG tablet, Take 1 tablet (600 mg total) by mouth every 8 (eight) hours as needed for fever or mild pain., Disp: 30 tablet, Rfl: 0 .  ondansetron (ZOFRAN) 4 MG tablet, ondansetron HCl 4 mg tablet  TAKE 1 TABLET BY MOUTH EVERY 8 HOURS AS NEEDED FOR NAUSEA, Disp: , Rfl:  .  Probiotic Product (PROBIOTIC ADVANCED PO), Take by mouth., Disp: , Rfl:  .  SUMAtriptan (IMITREX) 100 MG tablet, Take 100 mg by mouth every 2 (two) hours as needed. , Disp: , Rfl:    Patient Care Team: Erasmo DownerBacigalupo, Tiwanna Tuch M, MD as PCP - General (Family Medicine)    Objective:    Vitals: BP 103/75 (BP Location: Left Arm, Patient Position: Sitting, Cuff Size: Normal)   Pulse 97   Temp (!)  96.9 F (36.1 C) (Temporal)   Resp 16   Ht 4' 11.5" (1.511 m)   Wt 105 lb 3.2 oz (47.7 kg)   LMP 06/14/2019 (Exact Date)   BMI 20.89 kg/m    Vitals:   06/18/19 0914  BP: 103/75  Pulse: 97  Resp: 16  Temp: (!) 96.9 F (36.1 C)  TempSrc: Temporal  Weight: 105 lb 3.2 oz (47.7 kg)  Height: 4' 11.5" (1.511 m)     Physical Exam Vitals signs reviewed.  Constitutional:      General: She is not in acute distress.    Appearance: Normal appearance.  She is well-developed. She is not diaphoretic.  HENT:     Head: Normocephalic and atraumatic.     Right Ear: Tympanic membrane, ear canal and external ear normal.     Left Ear: Tympanic membrane, ear canal and external ear normal.  Eyes:     General: No scleral icterus.    Conjunctiva/sclera: Conjunctivae normal.     Pupils: Pupils are equal, round, and reactive to light.  Neck:     Musculoskeletal: Neck supple.     Thyroid: No thyromegaly.  Cardiovascular:     Rate and Rhythm: Normal rate and regular rhythm.     Pulses: Normal pulses.     Heart sounds: Normal heart sounds. No murmur.  Pulmonary:     Effort: Pulmonary effort is normal. No respiratory distress.     Breath sounds: Normal breath sounds. No wheezing or rales.  Abdominal:     General: There is no distension.     Palpations: Abdomen is soft.     Tenderness: There is no abdominal tenderness.  Musculoskeletal:        General: No deformity.     Right lower leg: No edema.     Left lower leg: No edema.  Lymphadenopathy:     Cervical: No cervical adenopathy.  Skin:    General: Skin is warm and dry.     Capillary Refill: Capillary refill takes less than 2 seconds.     Findings: No rash.  Neurological:     Mental Status: She is alert and oriented to person, place, and time. Mental status is at baseline.  Psychiatric:        Mood and Affect: Mood normal.        Behavior: Behavior normal.        Thought Content: Thought content normal.      Depression Screen PHQ  2/9 Scores 06/18/2019 11/30/2018 06/14/2018  PHQ - 2 Score 0 0 1  PHQ- 9 Score 0 4 6       Assessment & Plan:     Routine Health Maintenance and Physical Exam  Exercise Activities and Dietary recommendations Goals   None      There is no immunization history on file for this patient.  Health Maintenance  Topic Date Due  . HIV Screening  06/26/2002  . TETANUS/TDAP  06/26/2006  . INFLUENZA VACCINE  10/10/2019 (Originally 02/10/2019)  . PAP SMEAR-Modifier  09/14/2020     Discussed health benefits of physical activity, and encouraged her to engage in regular exercise appropriate for her age and condition.    Reviewed recent labs ROI sent to her OB in [facility] for TDAP and HIV records --------------------------------------------------------------------   Return in about 1 year (around 06/17/2020) for CPE.   The entirety of the information documented in the History of Present Illness, Review of Systems and Physical Exam were personally obtained by me. Portions of this information were initially documented by Rondel Baton , CMA and reviewed by me for thoroughness and accuracy.    Cybill Uriegas, Marzella Schlein, MD MPH [facility]

## 2019-10-18 ENCOUNTER — Telehealth: Payer: Self-pay | Admitting: Internal Medicine

## 2019-10-18 NOTE — Telephone Encounter (Addendum)
Spoke with the patient. Patient states that she has relocated to [place], [place] and lives close to the [place]. She has a POTS diagnosis and would like to know if Dr. Graciela Husbands could recommend a cardiologist in her area that would be appropriate for her dx. Advised the patient that I will forward the message to Dr. Graciela Husbands and we will call back with his response. Patient voiced appreciation.

## 2019-10-18 NOTE — Telephone Encounter (Signed)
Patient calling  Patient has moved to [place] and would need a referral from her last cardiologist for her to get a new cardiologist there Please call to discuss

## 2019-10-22 NOTE — Telephone Encounter (Signed)
DPR on file lmom with Dr.Klein's response below.

## 2019-10-22 NOTE — Telephone Encounter (Signed)
-----   Message from Duke Salvia, MD sent at 10/20/2019 11:27 AM EDT ----- L  dont know anyone there, but she can find someone [facility]  Thanks SK

## 2019-12-25 ENCOUNTER — Encounter: Payer: Self-pay | Admitting: Family Medicine

## 2019-12-25 ENCOUNTER — Telehealth (INDEPENDENT_AMBULATORY_CARE_PROVIDER_SITE_OTHER): Payer: 59 | Admitting: Family Medicine

## 2019-12-25 VITALS — BP 100/65 | HR 90 | Wt 107.0 lb

## 2019-12-25 DIAGNOSIS — G43109 Migraine with aura, not intractable, without status migrainosus: Secondary | ICD-10-CM

## 2019-12-25 DIAGNOSIS — G479 Sleep disorder, unspecified: Secondary | ICD-10-CM | POA: Diagnosis not present

## 2019-12-25 DIAGNOSIS — G901 Familial dysautonomia [Riley-Day]: Secondary | ICD-10-CM

## 2019-12-25 MED ORDER — TRAZODONE HCL 50 MG PO TABS: 25.0000 mg | ORAL_TABLET | Freq: Every evening | ORAL | 3 refills | Status: AC | PRN

## 2019-12-25 NOTE — Progress Notes (Signed)
MyChart Video Visit    Virtual Visit via Video Note   This visit type was conducted due to national recommendations for restrictions regarding the COVID-19 Pandemic (e.g. social distancing) in an effort to limit this patient's exposure and mitigate transmission in our community. This patient is at least at moderate risk for complications without adequate follow up. This format is felt to be most appropriate for this patient at this time. Physical exam was limited by quality of the video and audio technology used for the visit.   Patient location: Home Provider location: Office   Patient: Catherine Mendez   DOB: 1986/06/23   33 y.o. Female  MRN: [id number] Visit Date: 12/25/2019  Today's healthcare provider: Dortha Kern, PA   Chief Complaint  Patient presents with   Insomnia   Subjective    Insomnia Primary symptoms: sleep disturbance, premature morning awakening, malaise/fatigue.  The current episode started 1 to 4 weeks ago. The onset quality is sudden. The problem occurs nightly. The problem is unchanged. The symptoms are aggravated by work stress and anxiety. How many beverages per day that contain caffeine: 0 - 1.  Types of beverages you drink: soda. Past treatments include nothing. Typical bedtime:  11-12 P.M..  How long after going to bed to you fall asleep: 30-60 minutes.      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   POTS (postural orthostatic tachycardia syndrome) 11/05/2016   Elevated liver function tests 06/10/2015   Past Surgical History:  Procedure Laterality Date   CHOLECYSTECTOMY N/A 05/07/2018   Procedure: LAPAROSCOPIC CHOLECYSTECTOMY;  Surgeon: Henrene Dodge, MD;  Location: [facility] ORS;  Service: General;  Laterality: N/A;    COSMETIC SURGERY     dental implant    MOUTH SURGERY  2013   bottom dental implant    Family History  Problem Relation Age of Onset   Clotting disorder Mother    Bleeding Disorder Mother    Fibromyalgia Mother    Hypertension Father    Bleeding Disorder Sister        and Factor 12 deficiency   Aneurysm Maternal Aunt    Colon cancer Maternal Grandmother 60   Colon cancer Maternal Grandfather 60   Lung cancer Maternal Grandfather        smoker   Cirrhosis Paternal Grandmother    Heart disease Paternal Grandfather 74   Healthy Daughter    Social History   Tobacco Use   Smoking status: Never Smoker   Smokeless tobacco: Never Used  Building services engineer Use: Never used  Substance Use Topics   Alcohol use: Yes    Comment: wine occassionally   Drug use: No   Allergies  Allergen Reactions   Augmentin [Amoxicillin-Pot Clavulanate]     diarhea   Iodinated Diagnostic Agents Rash   Shellfish-Derived Products Rash    Medications: Outpatient Medications Prior to Visit  Medication Sig   acetaminophen (TYLENOL) 500 MG tablet Take 500 mg by mouth every 6 (six) hours as needed.   azelastine (ASTELIN) 0.1 % nasal spray Place 1 spray into both nostrils 2 (two) times daily. Use in each nostril as directed   fluticasone (FLONASE) 50 MCG/ACT nasal spray Place 2 sprays into both nostrils daily.   Fremanezumab-vfrm (AJOVY) 225 MG/1.5ML SOSY Inject into the  skin.   ibuprofen (ADVIL,MOTRIN) 600 MG tablet Take 1 tablet (600 mg total) by mouth every 8 (eight) hours as needed for fever or mild pain.   ondansetron (ZOFRAN) 4 MG tablet ondansetron HCl 4 mg tablet  TAKE 1 TABLET BY MOUTH EVERY 8 HOURS AS NEEDED FOR NAUSEA   Probiotic Product (PROBIOTIC ADVANCED PO) Take by mouth.   Rimegepant Sulfate (NURTEC) 75 MG TBDP Take 1 tablet by mouth.   rizatriptan (MAXALT) 10 MG tablet    SUMAtriptan (IMITREX) 100 MG tablet Take 100 mg by mouth every 2 (two) hours as  needed.    No facility-administered medications prior to visit.    Review of Systems  Constitutional: Positive for activity change, diaphoresis, fatigue and malaise/fatigue.  Respiratory: Negative for shortness of breath.   Cardiovascular: Negative for chest pain and palpitations.  Gastrointestinal: Positive for nausea. Negative for vomiting.  Psychiatric/Behavioral: Positive for sleep disturbance. The patient has insomnia.    Last thyroid functions Lab Results  Component Value Date   TSH 1.230 02/27/2019   T4TOTAL 7.1 09/09/2017   Last vitamin D Lab Results  Component Value Date   VD25OH 27.5 (L) 02/27/2019   Last vitamin B12 and Folate Lab Results  Component Value Date   VITAMINB12 478 02/27/2019      Objective    There were no vitals taken for this visit. BP Readings from Last 3 Encounters:  06/18/19 103/75  02/01/19 90/62  06/14/18 (!) 88/62   Wt Readings from Last 3 Encounters:  06/18/19 105 lb 3.2 oz (47.7 kg)  02/01/19 105 lb (47.6 kg)  06/14/18 112 lb (50.8 kg)   Physical Exam: WDWN female in no apparent distress.  Head: Normocephalic, atraumatic. Neck: Supple, NROM Respiratory: No apparent distress Psych: Normal mood and affect     Assessment & Plan     1. Sleep disturbance Usually sleeps 8-8.5 hours. Stress at work and anxiety reactions causes her to wake at 3:50 - 4:00 am. She can't get back to sleep unless she goes through her usual routine to prepare for bedtime. This also increased her migraines intensity. This has happened 6 times in the past 3 weeks. Has tried Melatonin in the past but it caused drowsiness the next day. Attempts to use Zoloft in the past for anxiety has worsened migraines. Willing to try Trazodone 1/2 tablet hs and will follow up as needed. - traZODone (DESYREL) 50 MG tablet; Take 0.5-1 tablets (25-50 mg total) by mouth at bedtime as needed for sleep.  Dispense: 30 tablet; Refill: 3  2. Migraine with aura and without status  migrainosus, not intractable Gets less intense migraines twice a month since starting the Ajovy. With sleep disturbance, she has had more migraines. Keeps triptans handy to use if migraines get established.  3. Dysautonomia ([place]) Followed by Dr. Caryl Comes (cardiologist). Symptoms of nausea, syncope, etc.have been better controlled since starting the Ajovy for her migraines  No follow-ups on file.     I discussed the assessment and treatment plan with the patient. The patient was provided an opportunity to ask questions and all were answered. The patient agreed with the plan and demonstrated an understanding of the instructions.   The patient was advised to call back or seek an in-person evaluation if the symptoms worsen or if the condition fails to improve as anticipated.  I provided 16 minutes of non-face-to-face time during this encounter.  Andres Shad, PA, have reviewed all documentation for this visit. The documentation on 12/25/19 for the  exam, diagnosis, procedures, and orders are all accurate and complete.   Dortha Kern, PA [facility] [phone] (phone) [phone] (fax)  [facility]

## 2019-12-27 ENCOUNTER — Telehealth: Payer: Self-pay

## 2020-01-03 ENCOUNTER — Telehealth (INDEPENDENT_AMBULATORY_CARE_PROVIDER_SITE_OTHER): Payer: 59 | Admitting: Internal Medicine

## 2020-01-03 ENCOUNTER — Telehealth: Payer: Self-pay

## 2020-01-03 ENCOUNTER — Other Ambulatory Visit: Payer: Self-pay

## 2020-01-03 ENCOUNTER — Encounter: Payer: Self-pay | Admitting: Internal Medicine

## 2020-01-03 VITALS — BP 101/62 | HR 82 | Ht 59.0 in | Wt 107.0 lb

## 2020-01-03 DIAGNOSIS — I951 Orthostatic hypotension: Secondary | ICD-10-CM | POA: Diagnosis not present

## 2020-01-03 DIAGNOSIS — G901 Familial dysautonomia [Riley-Day]: Secondary | ICD-10-CM | POA: Diagnosis not present

## 2020-01-03 NOTE — Telephone Encounter (Signed)
Catherine Mendez,  I CONSENT to the Telehealth visit.    Your [facility] team (Cardiologist [Heart Doctor] and Advanced Practice Provider [phone] Assistant; Nurse Practitioner]) has arranged for your next office appointment to be a virtual visit (also known as "Telehealth", "Telemedicine", "E-Visit").   We now offer virtual visits for all our patients.  This helps [place] to expand our ability to see patients in a timely and safe manner.  These visits are billed to your insurance just like traditional, in person, appointments.  Please review this IMPORTANT information about your upcoming appointment.   **PLEASE READ THE SECTION BELOW LABELED "CONSENT".**   **CALL OUR OFFICE WITH QUESTIONS.**   WHAT YOU NEED FOR YOUR VIRTUAL VISIT:  You will need a SmartPhone with microphone and video capability.  [If you are using MyChart to connect to your visit, it is also possible to use a desktop/laptop computer (with an Internet connection), as long as you have microphone and video capability.]  You will need to use Chrome, Edge or [employer] as your Wellsite geologist.  We highly recommend that you have a MyChart account as this will make connecting to your visit seamless.  A MyChart account not only allows you to connect to your provider for a virtual visit, but also allows you to see the results of all your tests, provider notes, medications and upcoming appointments.    A MyChart account is not absolutely necessary.  We can still complete your visit if you do not have one.    If you do not have a computer or SmartPhone with video/microphone capability or your Internet/cell service is weak on the day of your visit, we will do your visit by telephone.    A blood pressure cuff and scale are essential to collect your vital signs at home.  If you do not have these and you are unable to obtain them, please contact our office so that we can make arrangements for you.  If you have a pulse oximeter, Apple watch,  Kardia mobile device, etc, you can collect data from these devices as well to share with the provider for your visit.  These devices are not required for a virtual visit.    WHAT TO DO ON THE DAY OF YOUR APPOINTMENT: 30 minutes before your appointment:  Take your blood pressure, pulse or heart rate (if your blood pressure machine is able to collect it) and weight.  Write all these numbers down so you can give it to the nurse or medical assistant that calls.  Get all of the medications you currently take and put them where you will be sitting for the appointment.  The nurse/medical assistant will go over these with you when he/she calls.  15 minutes before your appointment:  You will receive a phone call from a nurse or medical assistant from our office.  The caller ID on your phone may indicate that the caller is either "[facility]" or "[place]".  However, the number may come across as spam.  Please turn off any spam blocker so you do not miss the call.  The nurse will:  Ask for your blood pressure, pulse, weight, height.  Go over all of your medications to make sure your chart is correct.  Review your allergies, smoking history, reason for appointment, etc.   Give you instructions on how to connect with the video platform or telephone.  IF YOUR VISIT IS BY TELEPHONE ONLY: After the nurse finishes getting you ready  for the visit, your provider will call you on the phone number you provide to [place].   TO CONNECT WITH YOUR PROVIDER FOR YOUR APPOINTMENT (BY VIDEO): You will either connect with the provider with your MyChart account or (if you are not using MyChart) with a link sent to your SmartPhone by text message.  If you are using MyChart, see below.  If you are not using MyChart, the nurse will send you the text message during the phone call.     If you are connecting with your MyChart account:   (The nurse that calls you will tell you when to do this):  You will log into your  account. At the top of your home screen, you should see the following prompt that tells you to "Begin your video visit with . . . ".  Click the green button (BEGIN VISIT).    The next screen will say "It's time to start your video visit!" Click the green button (BEGIN VIDEO VISIT)    There may be a screen that appears that asks for permission to use your camera and/or microphone.  Click ALLOW.  This will open the browser where your appointment will take place.   You may see the message: "Welcome.  Waiting for the call to begin."  Or, you will see that the nurse or your provider are already "in" the room waiting on you.   If you are connecting with a link sent to your SmartPhone via text: The nurse that calls you will send the link by text message. Click on the link (it should look something like this):     If asked to give permission to use the camera and or microphone, click ALLOW This will open the browser where your appointment will take place.      You may see the message: "Welcome.  Waiting for the call to begin."  Or, you will see that the nurse or your provider are already "in" the room waiting on you.    The controls for your visit look like the picture below.  Please note that this is what the microphone and camera look like when they are ON.  If muted, they will have a line through them.    After the appointment: Once your provider leaves the appointment, he/she will go over instructions with the nurse/medical assistant.  If needed, this person will call you with any instructions, appointments, etc.   A copy of your After Visit Summary (AVS) will be available later that day in your MyChart account.  This document will have all of your instructions, medications, appointments, etc.  If you are not using MyChart, we will mail it to your home.     *CONSENT FOR TELE-HEALTH VISIT - PLEASE REVIEW* By participating in the scheduled virtual visit (and any virtual visit  scheduled within 365 days of the printing of this document), I agree to the following:   I hereby voluntarily request, consent and authorize [facility] and its employed or contracted physicians, physician assistants, nurse practitioners or other licensed health care professionals (the Practitioner), to provide me with telemedicine health care services (the "Services") as deemed necessary by the treating Practitioner. I acknowledge and consent to receive the Services by the Practitioner via telemedicine. I understand that the telemedicine visit will involve communicating with the Practitioner through live audiovisual communication technology and the disclosure of certain medical information by electronic transmission. I acknowledge that I have been given the opportunity to request an  in-person assessment or other available alternative prior to the telemedicine visit and am voluntarily participating in the telemedicine visit.  I understand that I have the right to withhold or withdraw my consent to the use of telemedicine in the course of my care at any time, without affecting my right to future care or treatment, and that the Practitioner or I may terminate the telemedicine visit at any time. I understand that I have the right to inspect all information obtained and/or recorded in the course of the telemedicine visit and may receive copies of available information for a reasonable fee.  I understand that some of the potential risks of receiving the Services via telemedicine include:  Marland Kitchen Delay or interruption in medical evaluation due to technological equipment failure or disruption; . Information transmitted may not be sufficient (e.g. poor resolution of images) to allow for appropriate medical decision making by the Practitioner; and/or  . In rare instances, security protocols could fail, causing a breach of personal health information.  Furthermore, I acknowledge that it is my responsibility to provide  information about my medical history, conditions and care that is complete and accurate to the best of my ability. I acknowledge that Practitioner's advice, recommendations, and/or decision may be based on factors not within their control, such as incomplete or inaccurate data provided by me or distortions of diagnostic images or specimens that may result from electronic transmissions. I understand that the practice of medicine is not an exact science and that Practitioner makes no warranties or guarantees regarding treatment outcomes. I acknowledge that a copy of this consent can be made available to me via my patient portal [facility] MyChart), or I can request a printed copy by calling the office of [facility].    I understand that my insurance will be billed for this visit.   I have read or had this consent read to me. . I understand the contents of this consent, which adequately explains the benefits and risks of the Services being provided via telemedicine.  . I have been provided ample opportunity to ask questions regarding this consent and the Services and have had my questions answered to my satisfaction. . I give my informed consent for the services to be provided through the use of telemedicine in my medical care

## 2020-01-03 NOTE — Progress Notes (Signed)
Electrophysiology TeleHealth Note   Due to national recommendations of social distancing due to COVID 19, an audio/video telehealth visit is felt to be most appropriate for this patient at this time.  See MyChart message from today for the patient's consent to telehealth for [facility].   Date:  01/03/2020   ID:  Catherine Mendez, DOB 08/08/1985, MRN [id number]  Location: patient's home  Provider location: [address], [place]  Evaluation Performed: Follow-up visit  PCP:  Virginia Crews, MD    Electrophysiologist:  SK   Chief Complaint:   dysautonomia  History of Present Illness:    Catherine Mendez is a 34 y.o. female who presents via audio/video conferencing for a telehealth visit today.  Since last being seen in our clinic for dysautonomia with low BP, exercise intolerance and syncope *  the patient reports doing exceptionally well.  previously  treated her with fludrocortisone and midodrine with much improvement but she ended up taking  AJOVY for her migraine headaches w markedly improved heat tolerance, no longer taking midodrine/florinef  Able to walk outside take hot showers walk the dog etc. etc.  About 2 weeks ago she developed a UTI and was put on an antibiotic.  Has been struggling in the last 2 weeks with temperature fluctuations in her hands and feet.  The patient denies symptoms of fevers, chills, cough, or new SOB worrisome for COVID 19.    Past Medical History:  Diagnosis Date  . Anemia    in past   . Anxiety    medicine in college for deopression /anxiety   . Blood transfusion without reported diagnosis    2 with childbirth/ choleithias with pregnancy  . Clotting disorder ([place])   . Elevated liver function tests 06/10/2015  . 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 Gestational diabetes 2016   gestational  . POTS (postural orthostatic tachycardia syndrome)     Past Surgical History:  Procedure Laterality Date  . CHOLECYSTECTOMY N/A  05/07/2018   Procedure: LAPAROSCOPIC CHOLECYSTECTOMY;  Surgeon: Olean Ree, MD;  Location: [facility] ORS;  Service: General;  Laterality: N/A;  . COSMETIC SURGERY     dental implant   . MOUTH SURGERY  2013   bottom dental implant     Current Outpatient Medications  Medication Sig Dispense Refill  . acetaminophen (TYLENOL) 500 MG tablet Take 500 mg by mouth every 6 (six) hours as needed.    Marland Kitchen azelastine (ASTELIN) 0.1 % nasal spray Place 1 spray into both nostrils 2 (two) times daily. Use in each nostril as directed 30 mL 6  . cephALEXin (KEFLEX) 500 MG capsule Take 500 mg by mouth 4 (four) times daily.    . fluticasone (FLONASE) 50 MCG/ACT nasal spray Place 2 sprays into both nostrils daily. 16 g 6  . Fremanezumab-vfrm (AJOVY) 225 MG/1.5ML SOSY Inject into the skin.    Marland Kitchen ibuprofen (ADVIL,MOTRIN) 600 MG tablet Take 1 tablet (600 mg total) by mouth every 8 (eight) hours as needed for fever or mild pain. 30 tablet 0  . ondansetron (ZOFRAN) 4 MG tablet ondansetron HCl 4 mg tablet  TAKE 1 TABLET BY MOUTH EVERY 8 HOURS AS NEEDED FOR NAUSEA    . Probiotic Product (PROBIOTIC ADVANCED PO) Take by mouth.    . rizatriptan (MAXALT) 10 MG tablet Take 10 mg by mouth as needed.     . SUMAtriptan (IMITREX) 100 MG tablet Take 100 mg by mouth every 2 (two) hours as needed.     Marland Kitchen  Rimegepant Sulfate (NURTEC) 75 MG TBDP Take 1 tablet by mouth. (Patient not taking: Reported on 01/03/2020)    . traZODone (DESYREL) 50 MG tablet Take 0.5-1 tablets (25-50 mg total) by mouth at bedtime as needed for sleep. (Patient not taking: Reported on 01/03/2020) 30 tablet 3   No current facility-administered medications for this visit.    Allergies:   Augmentin [amoxicillin-pot clavulanate], Iodinated diagnostic agents, and Shellfish-derived products   Social History:  The patient  reports that she has never smoked. She has never used smokeless tobacco. She reports current alcohol use. She reports that she does not use drugs.    Family History:  The patient's   family history includes Aneurysm in her maternal aunt; Bleeding Disorder in her mother and sister; Cirrhosis in her paternal grandmother; Clotting disorder in her mother; Colon cancer (age of onset: 4) in her maternal grandfather and maternal grandmother; Fibromyalgia in her mother; Healthy in her daughter; Heart disease (age of onset: 64) in her paternal grandfather; Hypertension in her father; Lung cancer in her maternal grandfather.   ROS:  Please see the history of present illness.   All other systems are personally reviewed and negative.    Exam:    Vital Signs:  BP 101/62 (BP Location: Left Wrist, Patient Position: Sitting, Cuff Size: Normal)   Pulse 82   Ht 4\' 11"  (1.499 m)   Wt 107 lb (48.5 kg)   LMP 12/11/2019 (Exact Date)   BMI 21.61 kg/m         Labs/Other Tests and Data Reviewed:    Recent Labs: 02/27/2019: ALT 33; BUN 7; Creatinine, Ser 0.70; Hemoglobin 11.7; Platelets 248; Potassium 3.9; Sodium 139; TSH 1.230   Wt Readings from Last 3 Encounters:  01/03/20 107 lb (48.5 kg)  12/25/19 107 lb (48.5 kg)  06/18/19 105 lb 3.2 oz (47.7 kg)     Other studies personally reviewed:     ASSESSMENT & PLAN:    Dysautonomia  Migraine headaches  She has done exceedingly well since the introduction of her immunotherapy for her migraine headaches.  We will need to do a literature search to see whether there are other reports of benefit.  Now having problems with her hands and feet with temperature instability.  This is concurrent with a UTI and antibiotics.  Have suggested that she wait and see how this resolves.  Continue current strategies of heat avoidance  COVID 19 screen The patient denies symptoms of COVID 19 at this time.  The importance of social distancing was discussed today.  Follow-up:  Prn     Current medicines are reviewed at length with the patient today.   The patient does not have concerns regarding her medicines.   The following changes were made today:  none  Labs/ tests ordered today include:   No orders of the defined types were placed in this encounter.      Patient Risk:  after full review of this patients clinical status, I feel that they are at moderate  risk at this time.  Today, I have spent 18 minutes with the patient with telehealth technology discussing the above.  Signed, 14/07/20, MD  01/03/2020 11:09 AM     [facility] [address] [place] [phone] (office) [phone] (fax)

## 2020-01-10 NOTE — Patient Instructions (Signed)
Medication Instructions:  - Your physician recommends that you continue on your current medications as directed. Please refer to the Current Medication list given to you today.  *If you need a refill on your cardiac medications before your next appointment, please call your pharmacy*   Lab Work: - none ordered  If you have labs (blood work) drawn today and your tests are completely normal, you will receive your results only by: . MyChart Message (if you have MyChart) OR . A paper copy in the mail If you have any lab test that is abnormal or we need to change your treatment, we will call you to review the results.   Testing/Procedures: - none ordered   Follow-Up: At [facility], you and your health needs are our priority.  As part of our continuing mission to provide you with exceptional heart care, we have created designated Provider Care Teams.  These Care Teams include your primary Cardiologist (physician) and Advanced Practice Providers (APPs -  Physician Assistants and Nurse Practitioners) who all work together to provide you with the care you need, when you need it.  We recommend signing up for the patient portal called "MyChart".  Sign up information is provided on this After Visit Summary.  MyChart is used to connect with patients for Virtual Visits (Telemedicine).  Patients are able to view lab/test results, encounter notes, upcoming appointments, etc.  Non-urgent messages can be sent to your provider as well.   To learn more about what you can do with MyChart, go to [email or web address].    Your next appointment:   As needed   The format for your next appointment:   In Person  Provider:   Steven Klein, MD   Other Instructions n/a  

## 2020-01-25 ENCOUNTER — Encounter: Payer: Self-pay | Admitting: Family Medicine

## 2020-03-10 ENCOUNTER — Telehealth: Payer: Self-pay | Admitting: Internal Medicine

## 2020-03-10 NOTE — Telephone Encounter (Signed)
Patient has found out she is pregnant and will need to find a cardiologist in [place] that she can go see. She states she needs someone that deals with POT's and wants to know if Dr. Graciela Husbands can refer her to someone. She gave me a fax number to [facility] to fax records to for now.

## 2020-03-10 NOTE — Telephone Encounter (Signed)
Just realized patient sees Graciela Husbands in [place]. Rerouting to that pool. Thank you.

## 2020-03-11 NOTE — Telephone Encounter (Signed)
[facility]  DONT know anybody but [place] Artist will have some one Hope this helps SK

## 2020-03-11 NOTE — Telephone Encounter (Signed)
I called and spoke with the patient and advised Catherine Mendez of Dr. Odessa Fleming response. She voices understanding and states she may try to do a virtual visit with him at some point near Catherine Mendez delivery date (approx April).

## 2020-04-05 IMAGING — CT CT ABD-PELV W/O CM
2 of 4 series · 15 of 46 positions shown, 17 images · non-contrast
Comparison: CT of the abdomen pelvis dated 01/30/2018

CLINICAL DATA: 31-year-old female with abdominal pain.

EXAM:
CT ABDOMEN AND PELVIS WITHOUT CONTRAST
TECHNIQUE: Multidetector CT imaging of the abdomen and pelvis was performed
following the standard protocol without IV contrast.

[Series 2: axial st · axial · 0.69mm/px · z∈[-452,-82]mm · 12 of 85 slices shown, 14 images]
[im 7/85  soft-tissue]
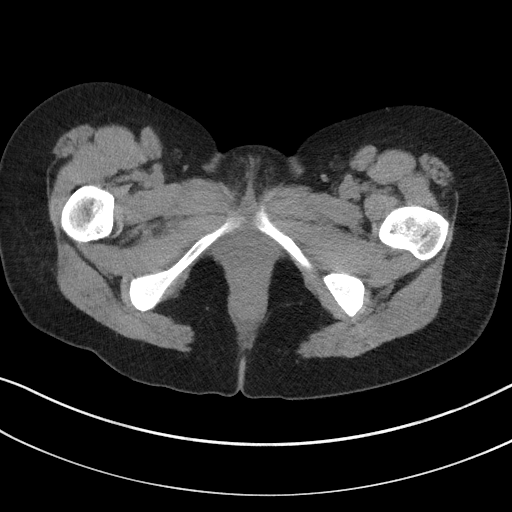
[im 7/85  bone]
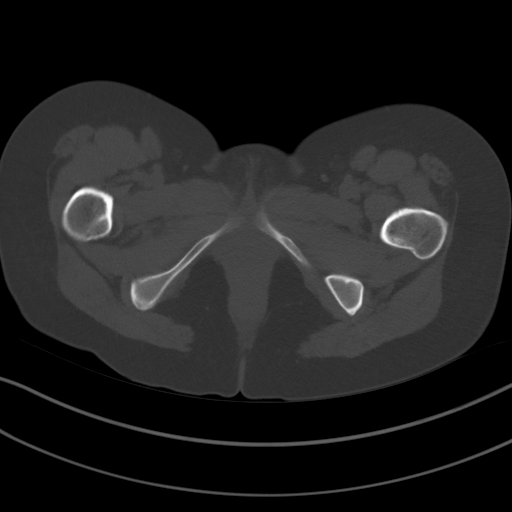
[im 14/85  soft-tissue]
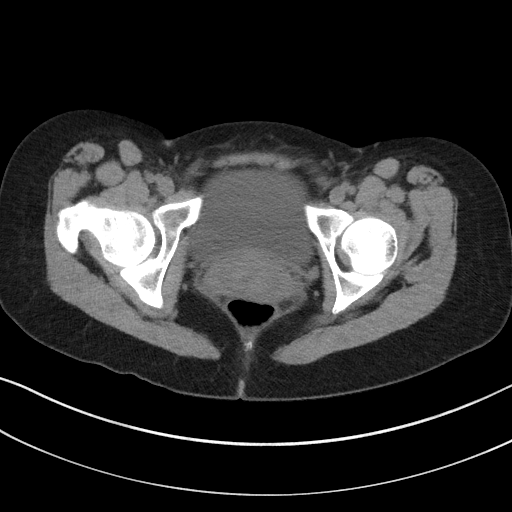
[im 21/85  soft-tissue]
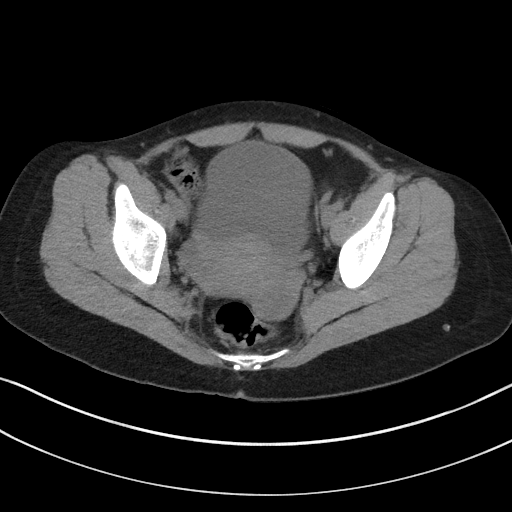
[im 27/85  soft-tissue]
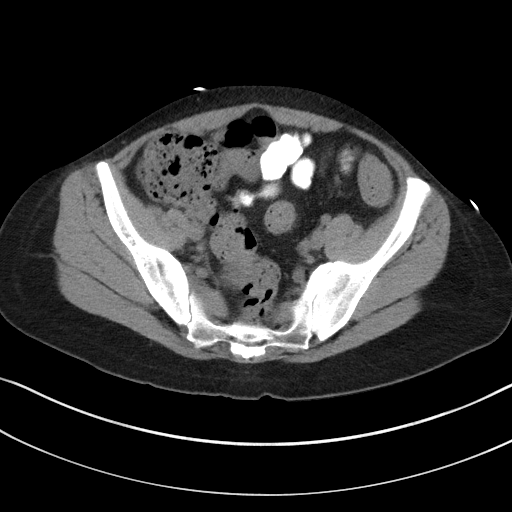
[im 34/85  soft-tissue]
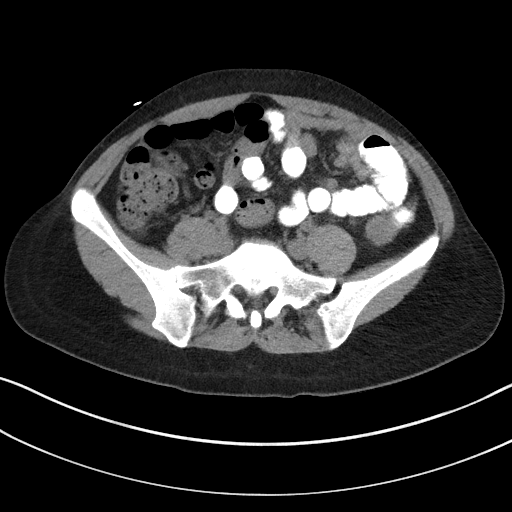
[im 41/85  soft-tissue]
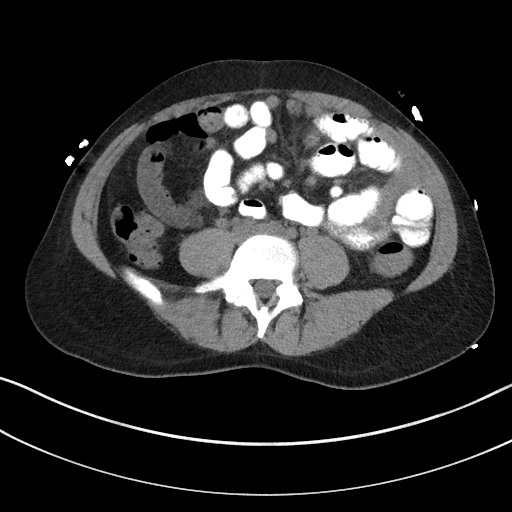
[im 48/85  soft-tissue]
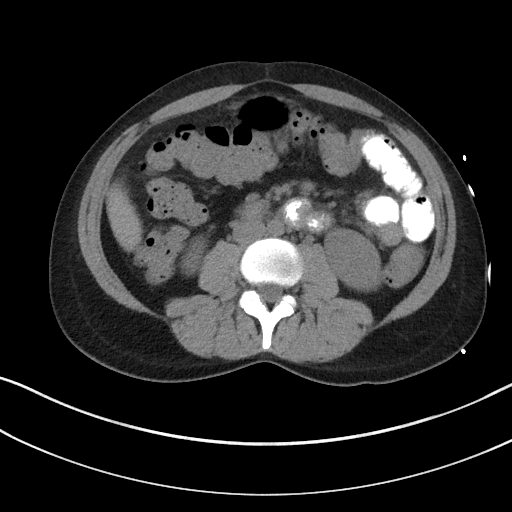
[im 54/85  soft-tissue]
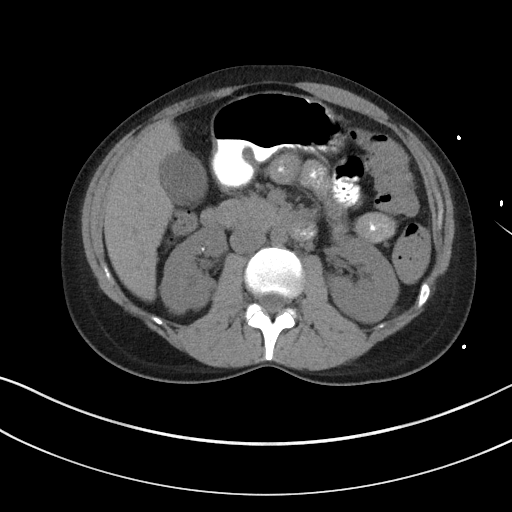
[im 61/85  soft-tissue]
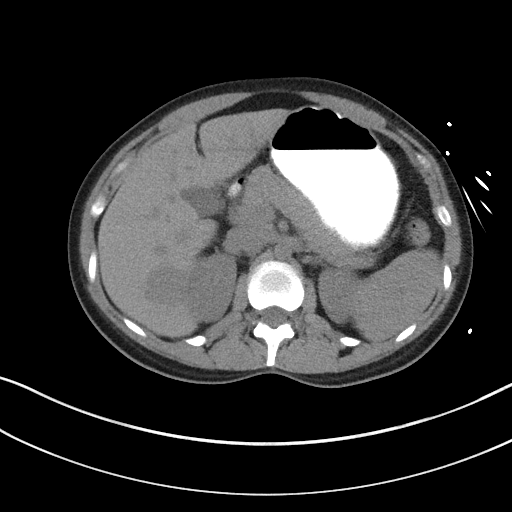
[im 61/85  bone]
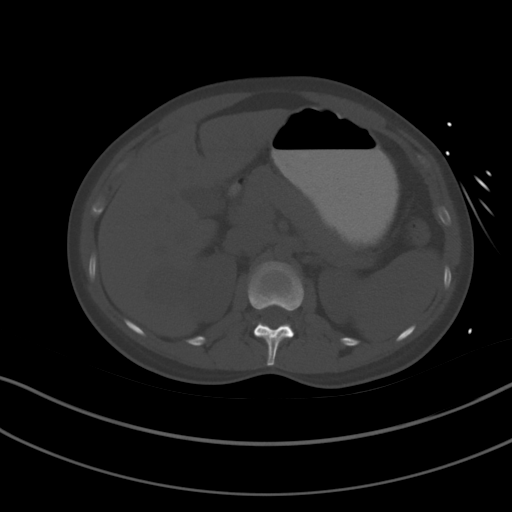
[im 68/85  soft-tissue]
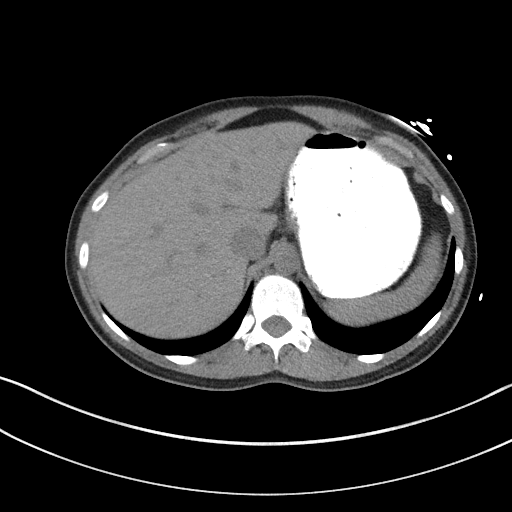
[im 74/85  soft-tissue]
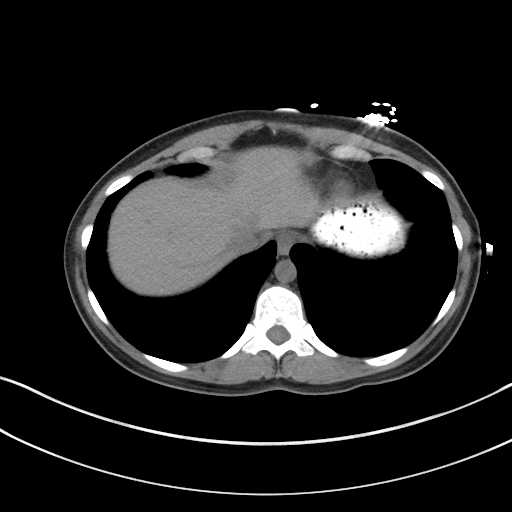
[im 81/85  soft-tissue]
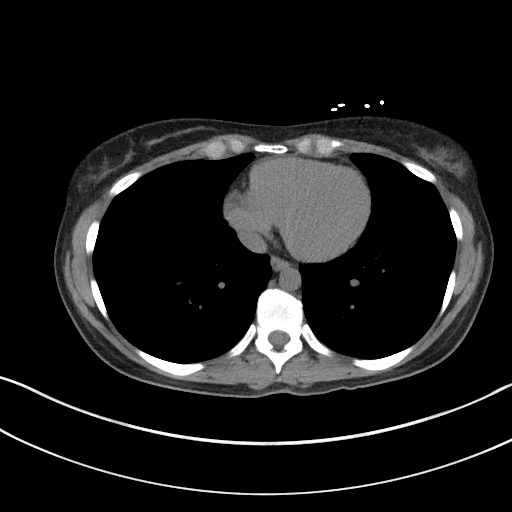

[Series 5: coronal st · coronal · 0.60mm/px · 3 of 77 slices shown]
[im 26/77  soft-tissue]
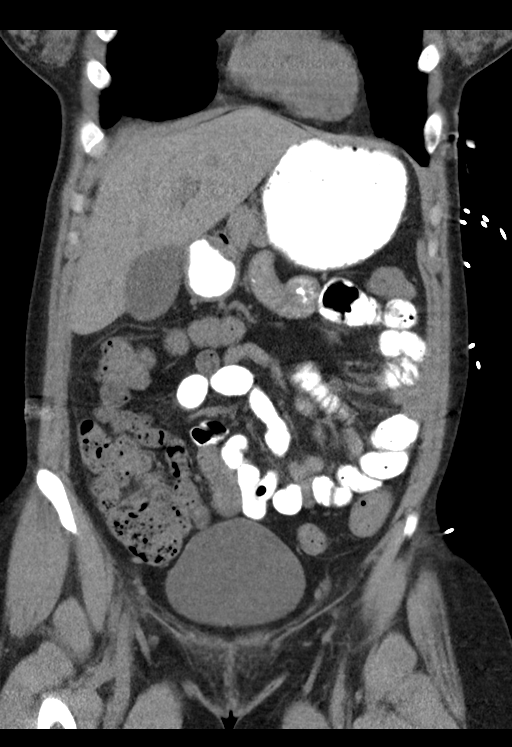
[im 34/77  soft-tissue]
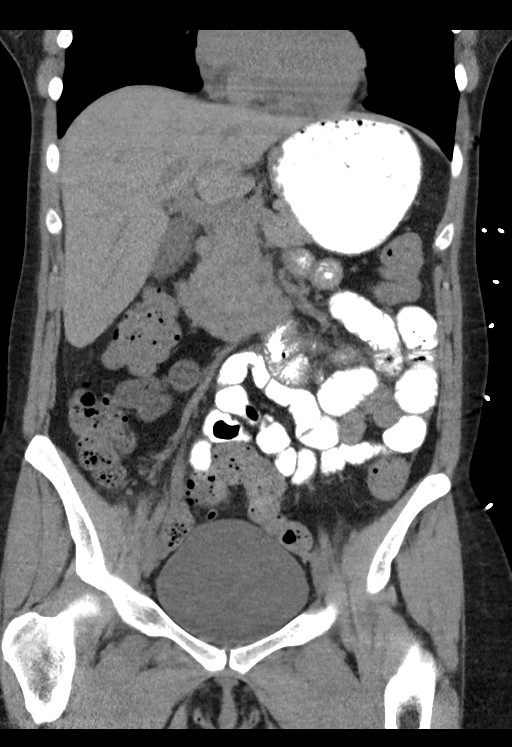
[im 43/77  soft-tissue]
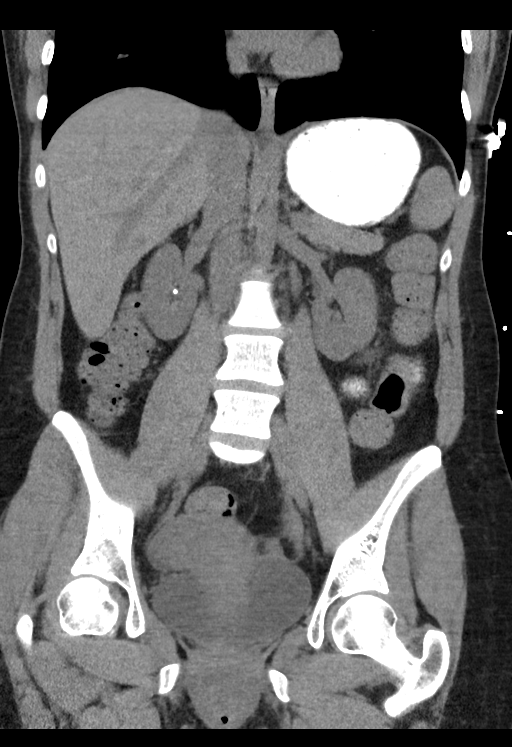

[15 of 46 positions shown; findings below may reference images not displayed]

FINDINGS: Evaluation of this exam is limited in the absence of intravenous
contrast.

Lower chest: The visualized lung bases are clear.

No intra-abdominal free air or free fluid.

Hepatobiliary: A 3 cm hypodense lesion in the right lobe of the
liver appears similar to prior CT. This is not characterized on
today's non-contrast CT but previously suggested to represent a
hemangioma. A subcentimeter hypodense lesion in the dome of the
liver is not characterized. No intrahepatic biliary ductal
dilatation. No calcified gallstone identified. There is however
slight thickened appearance of the gallbladder wall. Ultrasound may
provide better evaluation of the gallbladder.

Pancreas: Unremarkable. No pancreatic ductal dilatation or
surrounding inflammatory changes.

Spleen: A 1.8 cm hypodense lesion arising from the inferior pole of
the spleen appears similar to prior CT, indeterminate, possibly a
cyst or hemangioma.

Adrenals/Urinary Tract: The adrenal glands are unremarkable. There
is a 3 mm nonobstructing right renal inferior pole calculus. The
left kidney is unremarkable. The visualized ureters and urinary
bladder appear unremarkable as well.

Stomach/Bowel: Mildly thickened appearance of the jejunal folds in
the left hemiabdomen likely related to underdistention. No bowel
obstruction or active inflammation. Normal appendix.

Vascular/Lymphatic: The abdominal aorta and IVC are grossly
unremarkable on this noncontrast CT. No portal venous gas. There is
no adenopathy.

Reproductive: The uterus is retroflexed. The ovaries are grossly
unremarkable as visualized.

Other: None

Musculoskeletal: No acute or significant osseous findings.
IMPRESSION: 1. Mildly thickened appearing gallbladder wall. Further evaluation
with ultrasound is recommended.
2. No bowel obstruction or active inflammation. Normal appendix.
3. A 3 mm nonobstructing right renal inferior pole calculus. No
hydronephrosis.
